# Patient Record
Sex: Female | Born: 1995
Health system: Southern US, Community
[De-identification: ages and names within clinical notes are randomized; demographics above are authoritative.]

## PROBLEM LIST (undated history)

## (undated) DIAGNOSIS — M549 Dorsalgia, unspecified: Secondary | ICD-10-CM

## (undated) DIAGNOSIS — R0602 Shortness of breath: Secondary | ICD-10-CM

## (undated) DIAGNOSIS — F32A Depression, unspecified: Secondary | ICD-10-CM

## (undated) DIAGNOSIS — F419 Anxiety disorder, unspecified: Secondary | ICD-10-CM

## (undated) DIAGNOSIS — F329 Major depressive disorder, single episode, unspecified: Secondary | ICD-10-CM

## (undated) DIAGNOSIS — A749 Chlamydial infection, unspecified: Secondary | ICD-10-CM

## (undated) DIAGNOSIS — M2550 Pain in unspecified joint: Secondary | ICD-10-CM

## (undated) DIAGNOSIS — F988 Other specified behavioral and emotional disorders with onset usually occurring in childhood and adolescence: Secondary | ICD-10-CM

## (undated) DIAGNOSIS — A599 Trichomoniasis, unspecified: Secondary | ICD-10-CM

## (undated) DIAGNOSIS — O139 Gestational [pregnancy-induced] hypertension without significant proteinuria, unspecified trimester: Secondary | ICD-10-CM

## (undated) DIAGNOSIS — K589 Irritable bowel syndrome without diarrhea: Secondary | ICD-10-CM

## (undated) DIAGNOSIS — G4730 Sleep apnea, unspecified: Secondary | ICD-10-CM

## (undated) DIAGNOSIS — Z8619 Personal history of other infectious and parasitic diseases: Secondary | ICD-10-CM

## (undated) HISTORY — DX: Chlamydial infection, unspecified: A74.9

## (undated) HISTORY — PX: NO PAST SURGERIES: SHX2092

## (undated) HISTORY — DX: Irritable bowel syndrome, unspecified: K58.9

## (undated) HISTORY — DX: Other specified behavioral and emotional disorders with onset usually occurring in childhood and adolescence: F98.8

## (undated) HISTORY — DX: Dorsalgia, unspecified: M54.9

## (undated) HISTORY — DX: Sleep apnea, unspecified: G47.30

## (undated) HISTORY — DX: Shortness of breath: R06.02

## (undated) HISTORY — DX: Personal history of other infectious and parasitic diseases: Z86.19

## (undated) HISTORY — DX: Pain in unspecified joint: M25.50

---
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
## 2014-07-24 ENCOUNTER — Encounter: Payer: Self-pay | Admitting: Advanced Practice Midwife

## 2015-04-18 LAB — OB RESULTS CONSOLE HGB/HCT, BLOOD
HEMATOCRIT: 37 %
Hemoglobin: 12.4 g/dL

## 2015-04-18 LAB — OB RESULTS CONSOLE PLATELET COUNT: Platelets: 192 10*3/uL

## 2015-04-18 LAB — OB RESULTS CONSOLE HEPATITIS B SURFACE ANTIGEN: HEP B S AG: NEGATIVE

## 2015-04-18 LAB — OB RESULTS CONSOLE HIV ANTIBODY (ROUTINE TESTING): HIV: NONREACTIVE

## 2015-04-18 LAB — OB RESULTS CONSOLE VARICELLA ZOSTER ANTIBODY, IGG: VARICELLA IGG: IMMUNE

## 2015-04-18 LAB — OB RESULTS CONSOLE RPR: RPR: NONREACTIVE

## 2015-07-24 LAB — OB RESULTS CONSOLE RPR: RPR: NONREACTIVE

## 2015-07-24 LAB — OB RESULTS CONSOLE HIV ANTIBODY (ROUTINE TESTING): HIV: NONREACTIVE

## 2015-09-19 LAB — OB RESULTS CONSOLE GC/CHLAMYDIA
Chlamydia: NEGATIVE
Gonorrhea: POSITIVE

## 2015-09-19 LAB — OB RESULTS CONSOLE GBS: GBS: POSITIVE

## 2015-09-23 NOTE — L&D Delivery Note (Cosign Needed)
Delivery Note Pt pushed well and at 3:57 AM a viable female was delivered via Vaginal, Spontaneous Delivery (Presentation: Left Occiput Anterior).  APGAR:8 ,9 ; weight 5 lb 11.9 oz (2605 g).  Cord clamped and cut by mother of pt. Placenta status: Intact, Spontaneous.  Cord: 3 vessels   Anesthesia: None  Episiotomy: None Lacerations: None Est. Blood Loss (mL): 372  Mom to postpartum.  Baby to Couplet care / Skin to Skin.  Cam HaiSHAW, Remer Couse CNM 09/29/2015, 4:16 AM

## 2015-09-28 ENCOUNTER — Inpatient Hospital Stay (HOSPITAL_COMMUNITY)
Admission: AD | Admit: 2015-09-28 | Discharge: 2015-10-01 | DRG: 775 | Disposition: A | Discharge status: Home / Self Care | Payer: Medicaid Other | Source: Ambulatory Visit | Attending: Obstetrics and Gynecology | Admitting: Obstetrics and Gynecology

## 2015-09-28 ENCOUNTER — Encounter (HOSPITAL_COMMUNITY): Payer: Self-pay

## 2015-09-28 DIAGNOSIS — Z818 Family history of other mental and behavioral disorders: Secondary | ICD-10-CM

## 2015-09-28 DIAGNOSIS — Z3A38 38 weeks gestation of pregnancy: Secondary | ICD-10-CM

## 2015-09-28 DIAGNOSIS — Z8249 Family history of ischemic heart disease and other diseases of the circulatory system: Secondary | ICD-10-CM

## 2015-09-28 DIAGNOSIS — Z823 Family history of stroke: Secondary | ICD-10-CM | POA: Diagnosis not present

## 2015-09-28 DIAGNOSIS — Z833 Family history of diabetes mellitus: Secondary | ICD-10-CM

## 2015-09-28 DIAGNOSIS — O98219 Gonorrhea complicating pregnancy, unspecified trimester: Principal | ICD-10-CM | POA: Diagnosis present

## 2015-09-28 DIAGNOSIS — IMO0001 Reserved for inherently not codable concepts without codable children: Secondary | ICD-10-CM

## 2015-09-28 LAB — URINALYSIS, ROUTINE W REFLEX MICROSCOPIC
GLUCOSE, UA: NEGATIVE mg/dL
KETONES UR: 40 mg/dL — AB
Nitrite: NEGATIVE
PROTEIN: 30 mg/dL — AB
Specific Gravity, Urine: >1.03 — ABNORMAL HIGH (ref 1.005–1.030)
pH: 6 (ref 5.0–8.0)

## 2015-09-28 LAB — URINE MICROSCOPIC-ADD ON: RBC / HPF: NONE SEEN RBC/hpf (ref 0–5)

## 2015-09-28 LAB — PROTEIN / CREATININE RATIO, URINE
Creatinine, Urine: 333 mg/dL
Protein Creatinine Ratio: 0.12 mg/mg{Cre} (ref 0.00–0.15)
Total Protein, Urine: 40 mg/dL

## 2015-09-28 LAB — TYPE AND SCREEN
ABO/RH(D): O POS
Antibody Screen: NEGATIVE

## 2015-09-28 LAB — COMPREHENSIVE METABOLIC PANEL
ALT: 22 U/L (ref 14–54)
AST: 30 U/L (ref 15–41)
Albumin: 3.5 g/dL (ref 3.5–5.0)
Alkaline Phosphatase: 163 U/L — ABNORMAL HIGH (ref 38–126)
Anion gap: 11 (ref 5–15)
BUN: 10 mg/dL (ref 6–20)
CO2: 22 mmol/L (ref 22–32)
Calcium: 8.6 mg/dL — ABNORMAL LOW (ref 8.9–10.3)
Chloride: 100 mmol/L — ABNORMAL LOW (ref 101–111)
Creatinine, Ser: 0.55 mg/dL (ref 0.44–1.00)
GFR calc Af Amer: >60 mL/min (ref >60)
GFR calc non Af Amer: >60 mL/min (ref >60)
Glucose, Bld: 84 mg/dL (ref 65–99)
Potassium: 2.7 mmol/L — CL (ref 3.5–5.1)
Sodium: 133 mmol/L — ABNORMAL LOW (ref 135–145)
Total Bilirubin: 0.9 mg/dL (ref 0.3–1.2)
Total Protein: 7.1 g/dL (ref 6.5–8.1)

## 2015-09-28 LAB — ABO/RH: ABO/RH(D): O POS

## 2015-09-28 LAB — RAPID URINE DRUG SCREEN, HOSP PERFORMED
Amphetamines: NOT DETECTED
Barbiturates: NOT DETECTED
Benzodiazepines: NOT DETECTED
Cocaine: NOT DETECTED
Opiates: NOT DETECTED
Tetrahydrocannabinol: NOT DETECTED

## 2015-09-28 LAB — CBC
HCT: 36.3 % (ref 36.0–46.0)
Hemoglobin: 11.8 g/dL — ABNORMAL LOW (ref 12.0–15.0)
MCH: 27.9 pg (ref 26.0–34.0)
MCHC: 32.5 g/dL (ref 30.0–36.0)
MCV: 85.8 fL (ref 78.0–100.0)
Platelets: 238 10*3/uL (ref 150–400)
RBC: 4.23 MIL/uL (ref 3.87–5.11)
RDW: 14 % (ref 11.5–15.5)
WBC: 10.8 10*3/uL — ABNORMAL HIGH (ref 4.0–10.5)

## 2015-09-28 LAB — GROUP B STREP BY PCR: Group B strep by PCR: NEGATIVE

## 2015-09-28 MED ORDER — OXYCODONE-ACETAMINOPHEN 5-325 MG PO TABS: 2.0000 | ORAL_TABLET | ORAL | Status: DC | PRN

## 2015-09-28 MED ORDER — LACTATED RINGERS IV SOLN: INTRAVENOUS | Status: DC

## 2015-09-28 MED ORDER — FENTANYL CITRATE (PF) 100 MCG/2ML IJ SOLN: 50.0000 ug | INTRAMUSCULAR | Status: DC | PRN

## 2015-09-28 MED ORDER — LACTATED RINGERS IV SOLN: 500.0000 mL | INTRAVENOUS | Status: DC | PRN

## 2015-09-28 MED ORDER — PENICILLIN G POTASSIUM 5000000 UNITS IJ SOLR
2.5000 10*6.[IU] | INTRAVENOUS | Status: DC
  Filled 2015-09-28 (×2): qty 2.5

## 2015-09-28 MED ORDER — MISOPROSTOL 50MCG HALF TABLET
50.0000 ug | ORAL_TABLET | ORAL | Status: DC
  Filled 2015-09-28: qty 0.5

## 2015-09-28 MED ORDER — CEFAZOLIN SODIUM 1 G IJ SOLR
2.0000 g | Freq: Once | INTRAMUSCULAR | Status: AC
  Administered 2015-09-28: 2 g via INTRAMUSCULAR
  Filled 2015-09-28: qty 20

## 2015-09-28 MED ORDER — OXYTOCIN 10 UNIT/ML IJ SOLN: 2.5000 [IU]/h | INTRAVENOUS | Status: DC

## 2015-09-28 MED ORDER — ACETAMINOPHEN 325 MG PO TABS: 650.0000 mg | ORAL_TABLET | ORAL | Status: DC | PRN

## 2015-09-28 MED ORDER — OXYCODONE-ACETAMINOPHEN 5-325 MG PO TABS: 1.0000 | ORAL_TABLET | ORAL | Status: DC | PRN

## 2015-09-28 MED ORDER — ONDANSETRON HCL 4 MG/2ML IJ SOLN: 4.0000 mg | Freq: Four times a day (QID) | INTRAMUSCULAR | Status: DC | PRN

## 2015-09-28 MED ORDER — OXYTOCIN BOLUS FROM INFUSION: 500.0000 mL | INTRAVENOUS | Status: DC

## 2015-09-28 MED ORDER — TERBUTALINE SULFATE 1 MG/ML IJ SOLN
0.2500 mg | Freq: Once | INTRAMUSCULAR | Status: DC | PRN
  Filled 2015-09-28: qty 1

## 2015-09-28 MED ORDER — PENICILLIN G POTASSIUM 5000000 UNITS IJ SOLR
5.0000 10*6.[IU] | Freq: Once | INTRAVENOUS | Status: DC
  Filled 2015-09-28: qty 5

## 2015-09-28 MED ORDER — MISOPROSTOL 25 MCG QUARTER TABLET
25.0000 ug | ORAL_TABLET | ORAL | Status: DC
  Administered 2015-09-28: 25 ug via VAGINAL
  Filled 2015-09-28 (×2): qty 1

## 2015-09-28 MED ORDER — CITRIC ACID-SODIUM CITRATE 334-500 MG/5ML PO SOLN: 30.0000 mL | ORAL | Status: DC | PRN

## 2015-09-28 MED ORDER — AMPICILLIN 500 MG PO CAPS: 500.0000 mg | ORAL_CAPSULE | Freq: Four times a day (QID) | ORAL | Status: DC

## 2015-09-28 MED ORDER — LIDOCAINE HCL (PF) 1 % IJ SOLN
30.0000 mL | INTRAMUSCULAR | Status: DC | PRN
  Filled 2015-09-28: qty 30

## 2015-09-28 MED ORDER — FLEET ENEMA 7-19 GM/118ML RE ENEM: 1.0000 | ENEMA | Freq: Every day | RECTAL | Status: DC | PRN

## 2015-09-28 NOTE — Progress Notes (Signed)
Patient inquiring about whether the baby will go to the nursery. Patient informed of model of care in which the baby stays in mothers room for bonding and care. Patient states she will "have to call someone to come to hospital and take care of baby". Nurse asked patient if she was going to care for baby in hospital. Patient states "I am tired and I will need to sleep".

## 2015-09-28 NOTE — Progress Notes (Signed)
IV attempt by Mary [name] RNC. Attempt unsuccessful. During attempt pt agitated, crying. Patient told mother and nursing staff to "get away from her". Pt refusing additional IV attempts.

## 2015-09-28 NOTE — Progress Notes (Signed)
Dr. Emelda FearFerguson notified patient refusing IV initiation/antibiotics, monitoring, vaginal exams.

## 2015-09-28 NOTE — Progress Notes (Signed)
Patient ID: Mary [name], female   DOB: [birth year], 20 y.o.   MRN: [id number] Filed Vitals:   09/28/15 1636 09/28/15 1741 09/28/15 1841 09/28/15 2058  BP: 137/83 144/93 154/80   Pulse: 81 93 78 65  Temp:  98.2 F (36.8 C)  97.4 F (36.3 C)  TempSrc:    Oral  Resp: 18 18  20   Height:      Weight:       Patient curled up in fetal position in bathtub with pillow (no water) States is comfortable there and wants to sleepl  FHR has been reactive on cordless monitor UCs irregular  Patient would only let nurse insert Cytotec.  Continues to refuse IV for Pitocin or antibiotics I told her it is possible that the baby might get sick from GBS and have to stay in nursery Pt states "as long as I can go home, I don't care"  Dilation: 5 Effacement (%): 80 Station: -1 Presentation: Vertex Exam by:: e. poore, rn  Will continue to observe

## 2015-09-28 NOTE — MAU Note (Signed)
Patient presents with prom around 1100. Patient denies any contractions. Fetus active.

## 2015-09-28 NOTE — H&P (Signed)
Mary [name] G2P1001 @ [redacted]w[redacted]d  20 y.o. female presenting for SROM @ 1100 am. Irregular contractions. No bleeding. Receives care in [place]. No records with her. Reports she was treated for Gonorrhea and CX 2 days ago.She has had 1 vaginal delivery 4 years ago and reports no problems in this pregnancy. GBS status unknown.  Maternal Medical History:  Reason for admission: Rupture of membranes.   Contractions: Onset was 3-5 hours ago.   Frequency: irregular.   Perceived severity is mild.    Fetal activity: Perceived fetal activity is normal.   Last perceived fetal movement was within the past hour.    Prenatal complications: Infection.   Prenatal Complications - Diabetes: none.    OB History    Gravida Para Term Preterm AB TAB SAB Ectopic Multiple Living   2 1 1  0 0 0 0 0 0 1     Past Medical History  Diagnosis Date  . Asthma     as a child  . Depression   . Anxiety   . Trichomonas    Past Surgical History  Procedure Laterality Date  . No past surgeries     Family History: family history includes Bipolar disorder in her mother; Cancer in her maternal grandmother and maternal uncle; Depression in her mother; Diabetes in her mother; Diabetes type II in her mother; Hypertension in her mother; Mental illness in her mother; Obesity in her mother; Seizures in her mother; Stroke in her maternal grandmother. Social History:  reports that she has never smoked. She has never used smokeless tobacco. She reports that she does not drink alcohol or use illicit drugs.   Prenatal Transfer Tool  Maternal Diabetes: No Genetic Screening:  Maternal Ultrasounds/Referrals:  Fetal Ultrasounds or other Referrals:   Maternal Substance Abuse:  No Significant Maternal Medications:  None Significant Maternal Lab Results:  Lab values include: Other:  Other Comments:  Gonorrhea positive; Treated for Ch/Gon  Review of Systems  Gastrointestinal: Positive for abdominal pain.  Genitourinary:     Water broke    Dilation: 2 Effacement (%): 80 Station: -2 Exam by:: Lori Clemmons CNM  Blood pressure 134/81, pulse 82, temperature 98 F (36.7 C), temperature source Oral, resp. rate 16, height 5\' 5"  (1.651 m), weight 83.915 kg (185 lb), last menstrual period 01/05/2015. Maternal Exam:  Uterine Assessment: Contraction strength is mild.  Contraction frequency is irregular.   Abdomen: Patient reports no abdominal tenderness. Fetal presentation: vertex  Introitus: Normal vulva. Vulva is negative for lesion.  Normal vagina.  Amniotic fluid character: clear.  Pelvis: adequate for delivery.   Cervix: Cervix evaluated by sterile speculum exam.     Fetal Exam Fetal Monitor Review: Mode: ultrasound.   Variability: moderate (6-25 bpm).   Pattern: accelerations present and no decelerations.    Fetal State Assessment: Category I - tracings are normal.     Physical Exam  Nursing note and vitals reviewed. Constitutional: She is oriented to person, place, and time. She appears well-developed and well-nourished. No distress.  HENT:  Head: Normocephalic and atraumatic.  Neck: Normal range of motion. Neck supple.  Cardiovascular: Normal rate and regular rhythm.   Respiratory: Effort normal and breath sounds normal. No respiratory distress.  Genitourinary: Vagina normal. Vulva exhibits no lesion.  Musculoskeletal: Normal range of motion.  Neurological: She is alert and oriented to person, place, and time.  Skin: Skin is warm and dry.  Psychiatric: She has a normal mood and affect. Her behavior is normal. Judgment and thought content  normal.    Prenatal labs: ABO, Rh:   Antibody:   Rubella:   RPR:    HBsAg:    HIV:    GBS:     Assessment/Plan: IUP @ 38+0 Admit for term labor Request Records- pending Gc/CH cx's GBS Epidural Expectant management    Clemmons,Lori Grissett 09/28/2015, 3:07 PM

## 2015-09-28 NOTE — Progress Notes (Signed)
RN attempted to get patient back on FHM, patient has refused for the past 15 minutes. Continuing to shower.

## 2015-09-28 NOTE — Progress Notes (Signed)
Pt asked if nursing staff could start IV at this time. The patient responded with "NO, not now". Patient informed antibiotics are needed to treat GBS positive status prior to delivery. Informed patient the lack of antibiotics puts baby at greater risk of transmission of GBS. Pt continues to refuse IV.

## 2015-09-28 NOTE — Progress Notes (Signed)
Mary [name] is a 20 y.o. G2P1001 at [redacted]w[redacted]d by  admitted for rupture of membranes  Subjective: Pt is very noncompliant. She does not want an IV. It was explained to her in detail about receiving IV antibiotics for positive GBS. She refused. She was advised she would be going against medical advice. Consulted with Dr. Emelda Fear . Will use PO Ampicillin  Objective: BP 154/80 mmHg  Pulse 78  Temp(Src) 98.2 F (36.8 C) (Oral)  Resp 18  Ht 5\' 5"  (1.651 m)  Wt 83.915 kg (185 lb)  BMI 30.79 kg/m2  LMP 01/05/2015      FHT:  Cat 1 UC:   irregular SVE:   Dilation: 2 Effacement (%): 80 Station: -2 Exam by:: [employer] CNM   Labs: Lab Results  Component Value Date   WBC 10.8* 09/28/2015   HGB 11.8* 09/28/2015   HCT 36.3 09/28/2015   MCV 85.8 09/28/2015   PLT 238 09/28/2015   Results for orders placed or performed during the hospital encounter of 09/28/15 (from the past 24 hour(s))  CBC     Status: Abnormal   Collection Time: 09/28/15  4:12 PM  Result Value Ref Range   WBC 10.8 (H) 4.0 - 10.5 K/uL   RBC 4.23 3.87 - 5.11 MIL/uL   Hemoglobin 11.8 (L) 12.0 - 15.0 g/dL   HCT 82.9 56.2 - 13.0 %   MCV 85.8 78.0 - 100.0 fL   MCH 27.9 26.0 - 34.0 pg   MCHC 32.5 30.0 - 36.0 g/dL   RDW 86.5 78.4 - 69.6 %   Platelets 238 150 - 400 K/uL  Type and screen If Patient has: 1) Previous uterine surgery in active labor. 2) History of 4 or more term/preterm deliveries.  3) Abnormal bleeding. 4) Multiple gestation. 5) Polyhydramnios,  6) History postpartum hemorrhage. 7) Suspicion of macrosom     Status: None   Collection Time: 09/28/15  4:12 PM  Result Value Ref Range   ABO/RH(D) O POS    Antibody Screen NEG    Sample Expiration 10/01/2015   Comprehensive metabolic panel     Status: Abnormal   Collection Time: 09/28/15  4:12 PM  Result Value Ref Range   Sodium 133 (L) 135 - 145 mmol/L   Potassium 2.7 (LL) 3.5 - 5.1 mmol/L   Chloride 100 (L) 101 - 111 mmol/L   CO2 22 22 - 32 mmol/L   Glucose, Bld 84 65 - 99 mg/dL   BUN 10 6 - 20 mg/dL   Creatinine, Ser 2.95 0.44 - 1.00 mg/dL   Calcium 8.6 (L) 8.9 - 10.3 mg/dL   Total Protein 7.1 6.5 - 8.1 g/dL   Albumin 3.5 3.5 - 5.0 g/dL   AST 30 15 - 41 U/L   ALT 22 14 - 54 U/L   Alkaline Phosphatase 163 (H) 38 - 126 U/L   Total Bilirubin 0.9 0.3 - 1.2 mg/dL   GFR calc non Af Amer >60 >60 mL/min   GFR calc Af Amer >60 >60 mL/min   Anion gap 11 5 - 15  Urinalysis, Routine w reflex microscopic (not at [facility])     Status: Abnormal   Collection Time: 09/28/15  4:35 PM  Result Value Ref Range   Color, Urine AMBER (A) YELLOW   APPearance CLEAR CLEAR   Specific Gravity, Urine >1.030 (H) 1.005 - 1.030   pH 6.0 5.0 - 8.0   Glucose, UA NEGATIVE NEGATIVE mg/dL   Hgb urine dipstick TRACE (A) NEGATIVE   Bilirubin  Urine MODERATE (A) NEGATIVE   Ketones, ur 40 (A) NEGATIVE mg/dL   Protein, ur 30 (A) NEGATIVE mg/dL   Nitrite NEGATIVE NEGATIVE   Leukocytes, UA SMALL (A) NEGATIVE  Protein / creatinine ratio, urine     Status: None   Collection Time: 09/28/15  4:35 PM  Result Value Ref Range   Creatinine, Urine 333.00 mg/dL   Total Protein, Urine 40 mg/dL   Protein Creatinine Ratio 0.12 0.00 - 0.15 mg/mg[Cre]  Urine rapid drug screen (hosp performed)     Status: None   Collection Time: 09/28/15  4:35 PM  Result Value Ref Range   Opiates NONE DETECTED NONE DETECTED   Cocaine NONE DETECTED NONE DETECTED   Benzodiazepines NONE DETECTED NONE DETECTED   Amphetamines NONE DETECTED NONE DETECTED   Tetrahydrocannabinol NONE DETECTED NONE DETECTED   Barbiturates NONE DETECTED NONE DETECTED  Urine microscopic-add on     Status: Abnormal   Collection Time: 09/28/15  4:35 PM  Result Value Ref Range   Squamous Epithelial / LPF 0-5 (A) NONE SEEN   WBC, UA 0-5 0 - 5 WBC/hpf   RBC / HPF NONE SEEN 0 - 5 RBC/hpf   Bacteria, UA RARE (A) NONE SEEN   Crystals CA OXALATE CRYSTALS (A) NEGATIVE   Urine-Other TRICHOMONAS PRESENT   Group B strep by PCR      Status: None   Collection Time: 09/28/15  5:00 PM  Result Value Ref Range   Group B strep by PCR NEGATIVE NEGATIVE    Assessment / Plan: Augment with Cyttoec  Labor:  Preeclampsia:   Fetal Wellbeing:  Category I Pain Control:  Labor support without medications I/D:  gbs negative result by PCR today Anticipated MOD:  NSVD  Mary [name] 09/28/2015, 7:10 PM

## 2015-09-29 ENCOUNTER — Encounter (HOSPITAL_COMMUNITY): Payer: Self-pay | Admitting: *Deleted

## 2015-09-29 DIAGNOSIS — Z3A38 38 weeks gestation of pregnancy: Secondary | ICD-10-CM

## 2015-09-29 DIAGNOSIS — O98219 Gonorrhea complicating pregnancy, unspecified trimester: Secondary | ICD-10-CM

## 2015-09-29 LAB — RPR: RPR Ser Ql: NONREACTIVE

## 2015-09-29 MED ORDER — SENNOSIDES-DOCUSATE SODIUM 8.6-50 MG PO TABS
2.0000 | ORAL_TABLET | ORAL | Status: DC
  Administered 2015-09-29 – 2015-10-01 (×2): 2 via ORAL
  Filled 2015-09-29 (×2): qty 2

## 2015-09-29 MED ORDER — WITCH HAZEL-GLYCERIN EX PADS: 1.0000 "application " | MEDICATED_PAD | CUTANEOUS | Status: DC | PRN

## 2015-09-29 MED ORDER — BENZOCAINE-MENTHOL 20-0.5 % EX AERO: 1.0000 "application " | INHALATION_SPRAY | CUTANEOUS | Status: DC | PRN

## 2015-09-29 MED ORDER — ONDANSETRON HCL 4 MG PO TABS: 4.0000 mg | ORAL_TABLET | ORAL | Status: DC | PRN

## 2015-09-29 MED ORDER — TETANUS-DIPHTH-ACELL PERTUSSIS 5-2.5-18.5 LF-MCG/0.5 IM SUSP: 0.5000 mL | Freq: Once | INTRAMUSCULAR | Status: DC

## 2015-09-29 MED ORDER — OXYTOCIN 10 UNIT/ML IJ SOLN
INTRAMUSCULAR | Status: AC
  Administered 2015-09-29: 10 [IU]
  Filled 2015-09-29: qty 1

## 2015-09-29 MED ORDER — DIBUCAINE 1 % RE OINT: 1.0000 "application " | TOPICAL_OINTMENT | RECTAL | Status: DC | PRN

## 2015-09-29 MED ORDER — SIMETHICONE 80 MG PO CHEW: 80.0000 mg | CHEWABLE_TABLET | ORAL | Status: DC | PRN

## 2015-09-29 MED ORDER — PROMETHAZINE HCL 25 MG/ML IJ SOLN
12.5000 mg | INTRAMUSCULAR | Status: DC | PRN
  Administered 2015-09-29: 12.5 mg via INTRAMUSCULAR
  Filled 2015-09-29: qty 1

## 2015-09-29 MED ORDER — LANOLIN HYDROUS EX OINT: TOPICAL_OINTMENT | CUTANEOUS | Status: DC | PRN

## 2015-09-29 MED ORDER — DIPHENHYDRAMINE HCL 25 MG PO CAPS: 25.0000 mg | ORAL_CAPSULE | Freq: Four times a day (QID) | ORAL | Status: DC | PRN

## 2015-09-29 MED ORDER — BUTORPHANOL TARTRATE 2 MG/ML IJ SOLN
2.0000 mg | INTRAMUSCULAR | Status: DC | PRN
  Administered 2015-09-29: 2 mg via INTRAMUSCULAR
  Filled 2015-09-29: qty 2

## 2015-09-29 MED ORDER — ZOLPIDEM TARTRATE 5 MG PO TABS: 5.0000 mg | ORAL_TABLET | Freq: Every evening | ORAL | Status: DC | PRN

## 2015-09-29 MED ORDER — PRENATAL MULTIVITAMIN CH
1.0000 | ORAL_TABLET | Freq: Every day | ORAL | Status: DC
  Administered 2015-09-29 – 2015-10-01 (×2): 1 via ORAL
  Filled 2015-09-29 (×3): qty 1

## 2015-09-29 MED ORDER — IBUPROFEN 600 MG PO TABS
600.0000 mg | ORAL_TABLET | Freq: Four times a day (QID) | ORAL | Status: DC
  Administered 2015-09-29 – 2015-10-01 (×10): 600 mg via ORAL
  Filled 2015-09-29 (×10): qty 1

## 2015-09-29 MED ORDER — ACETAMINOPHEN 325 MG PO TABS
650.0000 mg | ORAL_TABLET | ORAL | Status: DC | PRN
  Administered 2015-09-29 (×2): 650 mg via ORAL
  Filled 2015-09-29 (×2): qty 2

## 2015-09-29 MED ORDER — ONDANSETRON HCL 4 MG/2ML IJ SOLN: 4.0000 mg | INTRAMUSCULAR | Status: DC | PRN

## 2015-09-29 NOTE — Progress Notes (Signed)
Dr Emelda FearFerguson & L. Clemmons, CNM at bedside discussing with patient plan of care, and what patient is agreeable too. After thoroughly discussing risks of patient not receiving IV antibiotics & IV fluid, patient is continuing to refuse IV. Will continue to monitor & assess patient.

## 2015-09-29 NOTE — Progress Notes (Signed)
Mary LarocheM. [name], CNM at bedside. Discussing with patient plan of care. Patient refused to let CNM check cervix and place cytotec, but let RN do so. Patient is continuing to refuse IV, after discussing risks to baby if GBS not appropriately treated. Patient stated she doesn't care, as long as she can go home.
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
## 2015-09-29 NOTE — Lactation Note (Signed)
This note was [facility] Lactation Consultation Note; Initial visit with mom. Baby <6 lb Mom reports that baby has been latching well but she has been sleepy since bath. Baby skin to skin with mom. She is licking and nuzzling some at the breast then goes off to sleep. Reviewed basic teaching with mom but she does not appear interested in assist. Reports baby last fed before bath.. Reviewed feeding cues and encouraged to feed whenever she sees them Encouraged to call for assist prn BF brochure given with resources for support after DC. No questions at present.  Patient Name: Mary [name] Reason for consult: Initial assessment   Maternal Data Formula Feeding for Exclusion: Yes Reason for exclusion: Mother's choice to formula and breast feed on admission Does the patient have breastfeeding experience prior to this delivery?: No  Feeding Feeding Type: Breast Fed Length of feed: 7 min  LATCH Score/Interventions Latch: Too sleepy or reluctant, no latch achieved, no sucking elicited. Intervention(s): Adjust position;Assist with latch;Breast compression  Audible Swallowing: None Intervention(s): Skin to skin;Hand expression Intervention(s): Skin to skin;Hand expression  Type of Nipple: Everted at rest and after stimulation  Comfort (Breast/Nipple): Soft / non-tender     Hold (Positioning): Assistance needed to correctly position infant at breast and maintain latch. Intervention(s): Breastfeeding basics reviewed  LATCH Score: 5  Lactation Tools Discussed/Used     Consult Status Consult Status: Follow-up Date: 09/30/15 Follow-up type: In-patient    Pamelia HoitWeeks, [name], 2:03 PM

## 2015-09-30 NOTE — Clinical Social Work Maternal (Signed)
CLINICAL SOCIAL WORK MATERNAL/CHILD NOTE  Patient Details  Name: Mary [name] MRN: [id number] Date of Birth: 1996-06-05  Date:  09/30/2015  Clinical Social Worker Initiating Note:  Mary [name] Date/ Time Initiated:  09/30/15/1030     Child's Name:  Mary [name]   Legal Guardian:  Mother   Need for Interpreter:  None   Date of Referral:  09/29/15     Reason for Referral:  Other (Comment)   Referral Source:  [facility]   Address:  [address]  [place], [place] [address]  Phone number:   [phone])   Household Members:  Self, Minor Children, Relatives   Natural Supports (not living in the home):  Extended Family, Immediate Family   Professional Supports: Case Metallurgist ([place] and Therapist Mary [name])   Employment: Unemployed   Type of Work:     Education:      Pensions consultant:  [employer]   Other Resources:      Cultural/Religious Considerations Which May Impact Care: none noted  Strengths:  Ability to meet basic needs , Home prepared for child    Risk Factors/Current Problems:   (Hx of mental illness)   Cognitive State:  Alert , Able to Concentrate    Mood/Affect:   (guarded at times)   CSW Assessment:  Acknowledged order for social work consult regarding inappropriate comments made by mother.  Mother reportedly stated that the baby inside her was Mary [name] and that she should have aborted the child.   Met with mother and maternal grandmother.  Grandmother states that [name] and her 20 year old son reside with her.  When [name] was asked if she had hx of bipolar disorder, grandmother responded that "I was diagnosed with bipolar disorder".  Mother admits to hx of depression and anxiety.  She stated "maybe I should be tested for bipolar".  Informed that she receives mental health services from [facility] and is prescribed abilify.  She notes that she stopped the medication because of the pregnancy, but  plans to continue with medication.  She also reports hx of therapy and states that her therapist is Mary Thorton.  She also reports hx of psychiatric hospitalization at [facility] when she was age 20.    [name] is a single parent.  Informed that FOB is uninvolved and has not visited with her and newborn at the hospital.  [name] denies any hx of substance abuse.  Spoke with mother regarding her mental health history.  [name] acknowledged that her thoughts and comments during pregnancy and since arriving at the hospital have been irrational.  "I didn't feel normal during both pregnancies".   She described having irrational fears and al inappropriate and unusual thoughts and feelings.  She denies any intentional harm to self or other people during these times.  "This pregnancy was much better that the previous one".    Discussed some of the statements made since she was hospitalized.  She didn't want to talk about it and seemed embarrassed by some of the statements she have made.   Encouraged her to talk about the mood swings and inappropriate behaviors.  She did and commented that her statements were not rational.  She also acknowledge having mood swings.  She denies any SI or HI.  She denies having any negative feelings towards newborn.  Further noting that she was happy about having the baby.  She admits to being "lazy", and notes that she would never be alone with the baby.  Informed that her uncle is very supportive and visits often. She also notes that her mother is usually at home and she often have visits from other family members.  [name] states that she maintains custody of her 21 year old Mary [name] and has no hx of DSS involvement.   Mother was guarded and suspicious of CSW. Appropriate interaction noted with newborn during [place] visit, but she was short with her mother.  Provided supportive feedback.    Mother informed of social work Fish farm manager.  CSW Plan/Description:     Discussed signs/symptoms of PP Depression  and available resources Discussed hospital's drug screen policy.  Screening using umbilical cord is pending Spoke with her regarding referral to [facility] and she was in agreement.   Mother agreed to contact [place] to schedule apt. with her psychiatrist to resume medication and schedule appointment with Therapist ASAP.  Will continue to monitor drug screen.   No barriers to discharge   Mary [name] J, LCSW 09/30/2015, 1:49 PM

## 2015-09-30 NOTE — Lactation Note (Signed)
This note [name] Lactation Consultation [facility]  Patient Name: Mary Philis PiqueJasmine Dula [name] Date: 09/30/2015 Reason for consult: Follow-up assessment   Follow up with mom of 35 hour old infant. Infant with 4 BF for 10-25 minutes, 5 BF attempts, formula x 4 10-17 cc, 3 voids and 2 stools in last 24 hours. LATCH scores of 5-8 by bedside RN. Infant 5 lb 10.2 oz with a 2 % weight loss since birth. Mom reports that her breasts are more tender today. Advised her to begin pumping when infant wont latch to initiate milk supply. Mom at first declined and then agreed. Pump set up, pumping and cleaning reviewed. Infant cueing to feed, mom declined assistance with latch and gave the baby a bottle. Enc mom to call with questions/concerns.   Maternal Data Formula Feeding for Exclusion: No  Feeding Feeding Type: Breast Fed Nipple Type: Regular Length of feed: 10 min  LATCH Score/Interventions                      Lactation Tools Discussed/Used Pump Review: Setup, frequency, and cleaning;Milk Storage Initiated by:: Mary StainSharon Mercede Rollo, RN, IBCLC Date initiated:: 09/30/15   Consult Status Consult Status: Follow-up Date: 10/01/15 Follow-up type: In-patient    Mary [name] 09/30/2015, 3:21 PM

## 2015-09-30 NOTE — Progress Notes (Signed)
Post Partum Day 1 Subjective: no complaints, up ad lib, voiding and tolerating PO  Objective: Blood pressure 109/64, pulse 79, temperature 97.8 F (36.6 C), temperature source Oral, resp. rate 18, height 5\' 5"  (1.651 m), weight 185 lb (83.915 kg), last menstrual period 01/05/2015, SpO2 100 %, unknown if currently breastfeeding.  Physical Exam:  General: alert, cooperative, appears stated age and no distress Lochia: appropriate Uterine Fundus: firm Incision: n/a DVT Evaluation: No evidence of DVT seen on physical exam. Negative Homan's sign. No cords or calf tenderness.   Recent Labs  09/28/15 1612  HGB 11.8*  HCT 36.3    Assessment/Plan: Plan for discharge tomorrow   LOS: 2 days   LAWSON, MARIE DARLENE 09/30/2015, 9:04 AM

## 2015-10-01 MED ORDER — IBUPROFEN 600 MG PO TABS: 600.0000 mg | ORAL_TABLET | Freq: Four times a day (QID) | ORAL | Status: DC | PRN

## 2015-10-01 MED ORDER — ACETAMINOPHEN 325 MG PO TABS: 650.0000 mg | ORAL_TABLET | ORAL | Status: DC | PRN

## 2015-10-01 MED ORDER — SENNOSIDES-DOCUSATE SODIUM 8.6-50 MG PO TABS: 1.0000 | ORAL_TABLET | Freq: Every day | ORAL | Status: DC

## 2015-10-01 NOTE — Discharge Summary (Signed)
OB Discharge Summary     Patient Name: Mary [name] DOB: [birth year] MRN: [id number]  Date of admission: 09/28/2015 Delivering MD: Cam Hai D   Date of discharge: 10/01/2015  Admitting diagnosis: 37WKS WATER BROKE Intrauterine pregnancy: [redacted]w[redacted]d     Secondary diagnosis:  Active Problems:   Active labor at term  Additional problems:  Gonorrhea during pregnancy (treated 2 days prior to presentation)     Discharge diagnosis: Term Pregnancy Delivered                                                                                                Post partum procedures:none  Augmentation: none  Complications: None  Hospital course:  Onset of Labor With Vaginal Delivery     20 y.o. yo [id number] at [redacted]w[redacted]d was admitted in Latent Labor on 09/28/2015. Patient had an uncomplicated labor course as follows:  Membrane Rupture Time/Date: 11:00 AM ,09/28/2015   Intrapartum Procedures: Episiotomy: None [1]                                         Lacerations:  None [1]  Patient had a delivery of a Viable infant. 09/29/2015  Information for the patient's newborn:  Mikenna, Bunkley [[id number]]  Delivery Method: Vaginal, Spontaneous Delivery (Filed from Delivery Summary)     Pateint had an uncomplicated postpartum course.  She is ambulating, tolerating a regular diet, passing flatus, and urinating well. Patient is discharged home in stable condition on 10/01/2015.    Physical exam  Filed Vitals:   09/29/15 1731 09/30/15 0612 09/30/15 1728 10/01/15 0507  BP: 112/55 109/64 119/62 111/54  Pulse: 74 79 88 84  Temp: 97.6 F (36.4 C) 97.8 F (36.6 C) 97.9 F (36.6 C) 97.8 F (36.6 C)  TempSrc: Oral Oral Oral Oral  Resp: 20 18 20 16   Height:      Weight:      SpO2:  100%  100%   General: alert, cooperative and no distress Uterine Fundus: firm Incision: N/A DVT Evaluation: No evidence of DVT seen on physical exam. Negative Homan's sign. No cords or calf tenderness. No significant  calf/ankle edema. Labs: Lab Results  Component Value Date   WBC 10.8* 09/28/2015   HGB 11.8* 09/28/2015   HCT 36.3 09/28/2015   MCV 85.8 09/28/2015   PLT 238 09/28/2015   CMP Latest Ref Rng 09/28/2015  Glucose 65 - 99 mg/dL 84  BUN 6 - 20 mg/dL 10  Creatinine 1.30 - 8.65 mg/dL 7.84  Sodium 696 - 295 mmol/L 133(L)  Potassium 3.5 - 5.1 mmol/L 2.7(LL)  Chloride 101 - 111 mmol/L 100(L)  CO2 22 - 32 mmol/L 22  Calcium 8.9 - 10.3 mg/dL 2.8(U)  Total Protein 6.5 - 8.1 g/dL 7.1  Total Bilirubin 0.3 - 1.2 mg/dL 0.9  Alkaline Phos 38 - 126 U/L 163(H)  AST 15 - 41 U/L 30  ALT 14 - 54 U/L 22    Discharge instruction: per After Visit Summary and "Baby and Me Booklet".  After visit meds:    Medication List    TAKE these medications        acetaminophen 325 MG tablet  Commonly known as:  TYLENOL  Take 2 tablets (650 mg total) by mouth every 4 (four) hours as needed (for pain scale < 4).     ibuprofen 600 MG tablet  Commonly known as:  ADVIL,MOTRIN  Take 1 tablet (600 mg total) by mouth every 6 (six) hours as needed for mild pain or moderate pain.     prenatal multivitamin Tabs tablet  Take 1 tablet by mouth daily.     senna-docusate 8.6-50 MG tablet  Commonly known as:  Senokot-S  Take 1 tablet by mouth at bedtime.        Diet: routine diet  Activity: Advance as tolerated. Pelvic rest for 6 weeks.   Outpatient follow up:6 weeks Follow up Appt:No future appointments. Follow up Visit:No Follow-up on file.  Postpartum contraception: IUD Paragard  Newborn Data: Live born female  Birth Weight: 5 lb 11.9 oz (2605 g) APGAR: 8, 9  Baby Feeding: Breast Disposition:home with mother   10/01/2015 Palma HolterKanishka G Gunadasa, MD

## 2015-10-01 NOTE — Lactation Note (Signed)
This note was copied [facility] the [facility] Lactation Consultation Note; Baby just had formula and is asleep in bassinet. Mom reports breasts are getting fuller and has lump under right armpit. Reviewed engorgement prevention and treatment. Reports she has pump at home. Mom reports baby is latching well. States she will breastfeed after she has her breakfast. Mom has someone on phone and continued talking to them while I was in room. No questions at present.   Patient Name: Mary [name] [id number]'WToday's Date: 10/01/2015 Reason for consult: Follow-up assessment   Maternal Data Formula Feeding for Exclusion: No Does the patient have breastfeeding experience prior to this delivery?: No  Feeding Feeding Type: Formula  LATCH Score/Interventions                      Lactation Tools Discussed/Used     Consult Status Consult Status: Complete    Pamelia HoitWeeks, Pammie Chirino D 10/01/2015, 9:33 AM

## 2015-10-01 NOTE — Discharge Instructions (Signed)
Please contact [facility] to schedule apt. with your psychiatrist to resume medication and schedule appointment with Therapist as soon as possible  Vaginal Delivery, Care After Refer to this sheet in the next few weeks. These discharge instructions provide you with information on caring for yourself after delivery. Your health care provider may also give you specific instructions. Your treatment has been planned according to the most current medical practices available, but problems sometimes occur. Call your health care provider if you have any problems or questions after you go home. HOME CARE INSTRUCTIONS  Take over-the-counter or prescription medicines only as directed by your health care provider or pharmacist.  Do not drink alcohol, especially if you are breastfeeding or taking medicine to relieve pain.  Do not chew or smoke tobacco.  Do not use illegal drugs.  Continue to use good perineal care. Good perineal care includes:  Wiping your perineum from front to back.  Keeping your perineum clean.  Do not use tampons or douche until your health care provider says it is okay.  Shower, wash your hair, and take tub baths as directed by your health care provider.  Wear a well-fitting bra that provides breast support.  Eat healthy foods.  Drink enough fluids to keep your urine clear or pale yellow.  Eat high-fiber foods such as whole grain cereals and breads, brown rice, beans, and fresh fruits and vegetables every day. These foods may help prevent or relieve constipation.  Follow your health care provider's recommendations regarding resumption of activities such as climbing stairs, driving, lifting, exercising, or traveling.  Talk to your health care provider about resuming sexual activities. Resumption of sexual activities is dependent upon your risk of infection, your rate of healing, and your comfort and desire to resume sexual activity.  Try to have someone help you  with your household activities and your newborn for at least a few days after you leave the hospital.  Rest as much as possible. Try to rest or take a nap when your newborn is sleeping.  Increase your activities gradually.  Keep all of your scheduled postpartum appointments. It is very important to keep your scheduled follow-up appointments. At these appointments, your health care provider will be checking to make sure that you are healing physically and emotionally. SEEK MEDICAL CARE IF:   You are passing large clots from your vagina. Save any clots to show your health care provider.  You have a foul smelling discharge from your vagina.  You have trouble urinating.  You are urinating frequently.  You have pain when you urinate.  You have a change in your bowel movements.  You have increasing redness, pain, or swelling near your vaginal incision (episiotomy) or vaginal tear.  You have pus draining from your episiotomy or vaginal tear.  Your episiotomy or vaginal tear is separating.  You have painful, hard, or reddened breasts.  You have a severe headache.  You have blurred vision or see spots.  You feel sad or depressed.  You have thoughts of hurting yourself or your newborn.  You have questions about your care, the care of your newborn, or medicines.  You are dizzy or light-headed.  You have a rash.  You have nausea or vomiting.  You were breastfeeding and have not had a menstrual period within 12 weeks after you stopped breastfeeding.  You are not breastfeeding and have not had a menstrual period by the 12th week after delivery.  You have a fever. SEEK IMMEDIATE MEDICAL  CARE IF:   You have persistent pain.  You have chest pain.  You have shortness of breath.  You faint.  You have leg pain.  You have stomach pain.  Your vaginal bleeding saturates two or more sanitary pads in 1 hour.   This information is not intended to replace advice given to you by  your health care provider. Make sure you discuss any questions you have with your health care provider.   Document Released: 09/05/2000 Document Revised: 05/30/2015 Document Reviewed: 05/05/2012 Elsevier Interactive Patient Education [employer]

## 2015-10-07 NOTE — Discharge Summary (Signed)
  OB Discharge Summary     Patient Name: Mary [name] DOB: 05/30/1996 MRN: [id number]  Date of admission: 09/28/2015 Delivering MD: SHAW, KIMBERLY D   Date of discharge: 10/01/2015  Admitting diagnosis: 37WKS WATER BROKE Intrauterine pregnancy: [redacted]w[redacted]d     Secondary diagnosis:  Active Problems:   Active labor at term  Additional problems:  Gonorrhea during pregnancy (treated 2 days prior to presentation)     Discharge diagnosis: Term Pregnancy Delivered                                                                                                Post partum procedures:none  Augmentation: none  Complications: None  Hospital course:  Onset of Labor With Vaginal Delivery     20 y.o. yo [id number] at [redacted]w[redacted]d was admitted in Latent Labor on 09/28/2015. Patient had an uncomplicated labor course as follows:  Membrane Rupture Time/Date: 11:00 AM ,09/28/2015   Intrapartum Procedures: Episiotomy: None [1]                                         Lacerations:  None [1]  Patient had a delivery of a Viable infant. 09/29/2015  Information for the patient's newborn:  Hiller, Girl Cynda [[id number]]  Delivery Method: Vaginal, Spontaneous Delivery (Filed from Delivery Summary)     Pateint had an uncomplicated postpartum course.  She is ambulating, tolerating a regular diet, passing flatus, and urinating well. Patient is discharged home in stable condition on 10/01/2015.    Physical exam  Filed Vitals:   09/29/15 1731 09/30/15 0612 09/30/15 1728 10/01/15 0507  BP: 112/55 109/64 119/62 111/54  Pulse: 74 79 88 84  Temp: 97.6 F (36.4 C) 97.8 F (36.6 C) 97.9 F (36.6 C) 97.8 F (36.6 C)  TempSrc: Oral Oral Oral Oral  Resp: 20 18 20 16  Height:      Weight:      SpO2:  100%  100%   General: alert, cooperative and no distress Uterine Fundus: firm Incision: N/A DVT Evaluation: No evidence of DVT seen on physical exam. Negative Homan's sign. No cords or calf tenderness. No significant  calf/ankle edema. Labs: Lab Results  Component Value Date   WBC 10.8* 09/28/2015   HGB 11.8* 09/28/2015   HCT 36.3 09/28/2015   MCV 85.8 09/28/2015   PLT 238 09/28/2015   CMP Latest Ref Rng 09/28/2015  Glucose 65 - 99 mg/dL 84  BUN 6 - 20 mg/dL 10  Creatinine 0.44 - 1.00 mg/dL 0.55  Sodium 135 - 145 mmol/L 133(L)  Potassium 3.5 - 5.1 mmol/L 2.7(LL)  Chloride 101 - 111 mmol/L 100(L)  CO2 22 - 32 mmol/L 22  Calcium 8.9 - 10.3 mg/dL 8.6(L)  Total Protein 6.5 - 8.1 g/dL 7.1  Total Bilirubin 0.3 - 1.2 mg/dL 0.9  Alkaline Phos 38 - 126 U/L 163(H)  AST 15 - 41 U/L 30  ALT 14 - 54 U/L 22    Discharge instruction: per After Visit Summary and "Baby and Me Booklet".    After visit meds:    Medication List    TAKE these medications        acetaminophen 325 MG tablet  Commonly known as:  TYLENOL  Take 2 tablets (650 mg total) by mouth every 4 (four) hours as needed (for pain scale < 4).     ibuprofen 600 MG tablet  Commonly known as:  ADVIL,MOTRIN  Take 1 tablet (600 mg total) by mouth every 6 (six) hours as needed for mild pain or moderate pain.     prenatal multivitamin Tabs tablet  Take 1 tablet by mouth daily.     senna-docusate 8.6-50 MG tablet  Commonly known as:  Senokot-S  Take 1 tablet by mouth at bedtime.        Diet: routine diet  Activity: Advance as tolerated. Pelvic rest for 6 weeks.   Outpatient follow up:6 weeks Follow up Appt:No future appointments. Follow up Visit:No Follow-up on file.  Postpartum contraception: IUD Paragard  Newborn Data: Live born female  Birth Weight: 5 lb 11.9 oz (2605 g) APGAR: 8, 9  Baby Feeding: Breast Disposition:home with mother   10/01/2015 Kanishka G Gunadasa, MD    

## 2020-07-05 ENCOUNTER — Ambulatory Visit (INDEPENDENT_AMBULATORY_CARE_PROVIDER_SITE_OTHER): Payer: Medicaid Other | Admitting: Obstetrics and Gynecology

## 2020-07-05 ENCOUNTER — Encounter: Payer: Self-pay | Admitting: Obstetrics and Gynecology

## 2020-07-05 DIAGNOSIS — Z3A24 24 weeks gestation of pregnancy: Secondary | ICD-10-CM

## 2020-07-09 ENCOUNTER — Encounter: Payer: Self-pay | Admitting: Obstetrics & Gynecology

## 2020-07-09 NOTE — Progress Notes (Signed)
Patient not seen.

## 2020-07-10 ENCOUNTER — Ambulatory Visit (INDEPENDENT_AMBULATORY_CARE_PROVIDER_SITE_OTHER): Payer: Medicaid Other | Admitting: Certified Nurse Midwife

## 2020-07-10 ENCOUNTER — Encounter: Payer: Self-pay | Admitting: Certified Nurse Midwife

## 2020-07-10 ENCOUNTER — Other Ambulatory Visit: Payer: Self-pay

## 2020-07-10 ENCOUNTER — Other Ambulatory Visit (HOSPITAL_COMMUNITY)
Admission: RE | Admit: 2020-07-10 | Discharge: 2020-07-10 | Disposition: A | Discharge status: Home / Self Care | Payer: Medicaid Other | Source: Ambulatory Visit | Attending: Certified Nurse Midwife | Admitting: Certified Nurse Midwife

## 2020-07-10 VITALS — BP 100/68 | HR 88 | Wt 220.0 lb

## 2020-07-10 DIAGNOSIS — Z3480 Encounter for supervision of other normal pregnancy, unspecified trimester: Secondary | ICD-10-CM | POA: Insufficient documentation

## 2020-07-10 DIAGNOSIS — B373 Candidiasis of vulva and vagina: Secondary | ICD-10-CM

## 2020-07-10 DIAGNOSIS — B3731 Acute candidiasis of vulva and vagina: Secondary | ICD-10-CM

## 2020-07-10 DIAGNOSIS — N898 Other specified noninflammatory disorders of vagina: Secondary | ICD-10-CM

## 2020-07-10 DIAGNOSIS — Z3A25 25 weeks gestation of pregnancy: Secondary | ICD-10-CM | POA: Diagnosis not present

## 2020-07-10 DIAGNOSIS — O23592 Infection of other part of genital tract in pregnancy, second trimester: Secondary | ICD-10-CM

## 2020-07-10 DIAGNOSIS — O99210 Obesity complicating pregnancy, unspecified trimester: Secondary | ICD-10-CM | POA: Insufficient documentation

## 2020-07-10 DIAGNOSIS — O0930 Supervision of pregnancy with insufficient antenatal care, unspecified trimester: Secondary | ICD-10-CM | POA: Insufficient documentation

## 2020-07-10 DIAGNOSIS — Z8659 Personal history of other mental and behavioral disorders: Secondary | ICD-10-CM

## 2020-07-10 DIAGNOSIS — A5901 Trichomonal vulvovaginitis: Secondary | ICD-10-CM

## 2020-07-10 DIAGNOSIS — Z23 Encounter for immunization: Secondary | ICD-10-CM | POA: Diagnosis not present

## 2020-07-10 DIAGNOSIS — B009 Herpesviral infection, unspecified: Secondary | ICD-10-CM | POA: Insufficient documentation

## 2020-07-10 DIAGNOSIS — O98519 Other viral diseases complicating pregnancy, unspecified trimester: Secondary | ICD-10-CM

## 2020-07-10 MED ORDER — ASPIRIN EC 81 MG PO TBEC: 81.0000 mg | DELAYED_RELEASE_TABLET | Freq: Every day | ORAL | 0 refills | Status: DC

## 2020-07-10 NOTE — Patient Instructions (Signed)
Genetic Testing During Pregnancy Genetic testing during pregnancy is also called prenatal genetic testing. This type of testing can determine if your baby is at risk of being born with a disorder caused by abnormal genes or chromosomes (genetic disorder). Chromosomes contain genes that control how your baby will develop in your womb. There are many different genetic disorders. Examples of genetic disorders that may be found through genetic testing include Down syndrome and cystic fibrosis. Gene changes (mutations) can be passed down through families. Genetic testing is offered to all women before or during pregnancy. You can choose whether to have genetic testing. Why is genetic testing done? Genetic testing is done during pregnancy to find out whether your child is at risk for a genetic disorder. Having genetic testing allows you to:  Discuss your test results and options with a genetic counselor.  Prepare for a baby that may be born with a genetic disorder. Learning about the disorder ahead of time helps you be better prepared to manage it. Your health care providers can also be prepared in case your baby requires special care before or after birth.  Consider whether you want to continue with the pregnancy. In some cases, genetic testing may be done to learn about the traits a child will inherit. Types of genetic tests There are two basic types of genetic testing. Screening tests indicate whether your developing baby (fetus) is at higher risk for a genetic disorder. Diagnostic tests check actual fetal cells to diagnose a genetic disorder. Screening tests     Screening tests will not harm your baby. They are recommended for all pregnant women. Types of screening tests include:  Carrier screening. This test involves checking genes from both parents by testing their blood or saliva. The test checks to find out if the parents carry a genetic mutation that may be passed to a baby. In most cases,  both parents must carry the mutation for a baby to be at risk.  First trimester screening. This test combines a blood test with sound wave imaging of your baby (fetal ultrasound). This screening test checks for a risk of Down syndrome or other defects caused by having extra chromosomes. It also checks for defects of the heart, abdomen, or skeleton.  Second trimester screening also combines a blood test with a fetal ultrasound exam. It checks for a risk of genetic defects of the face, brain, spine, heart, or limbs.  Combined or sequential screening. This type of testing combines the results of first and second trimester screening. This type of testing may be more accurate than first or second trimester screening alone.  Cell-free DNA testing. This is a blood test that detects cells released by the placenta that get into the mother's blood. It can be used to check for a risk of Down syndrome, other extra chromosome syndromes, and disorders caused by abnormal numbers of sex chromosomes. This test can be done any time after 10 weeks of pregnancy.  Diagnostic tests Diagnostic tests carry slight risks of problems, including bleeding, infection, and loss of the pregnancy. These tests are done only if your baby is at risk for a genetic disorder. You may meet with a genetic counselor to discuss the risks and benefits before having diagnostic tests. Examples of diagnostic tests include:  Chorionic villus sampling (CVS). This involves a procedure to remove and test a sample of cells taken from the placenta. The procedure may be done between 10 and 12 weeks of pregnancy.  Amniocentesis. This involves a   procedure to remove and test a sample of fluid (amniotic fluid) and cells from the sac that surrounds the developing baby. The procedure may be done between 15 and 20 weeks of pregnancy. What do the results mean? For a screening test:  If the results are negative, it often means that your child is not at higher  risk. There is still a slight chance your child could have a genetic disorder.  If the results are positive, it does not mean your child will have a genetic disorder. It may mean that your child has a higher-than-normal risk for a genetic disorder. In that case, you may want to talk with a genetic counselor about whether you should have diagnostic genetic tests. For a diagnostic test:  If the result is negative, it is unlikely that your child will have a genetic disorder.  If the test is positive for a genetic disorder, it is likely that your child will have the disorder. The test may not tell how severe the disorder will be. Talk with your health care provider about your options. Questions to ask your health care provider Before talking to your health care provider about genetic testing, find out if there is a history of genetic disorders in your family. It may also help to know your family's ethnic origins. Then ask your health care provider the following questions:  Is my baby at risk for a genetic disorder?  What are the benefits of having genetic screening?  What tests are best for me and my baby?  What are the risks of each test?  If I get a positive result on a screening test, what is the next step?  Should I meet with a genetic counselor before having a diagnostic test?  Should my partner or other members of my family be tested?  How much do the tests cost? Will my insurance cover the testing? Summary  Genetic testing is done during pregnancy to find out whether your child is at risk for a genetic disorder.  Genetic testing is offered to all women before or during pregnancy. You can choose whether to have genetic testing.  There are two basic types of genetic testing. Screening tests indicate whether your developing baby (fetus) is at higher risk for a genetic disorder. Diagnostic tests check actual fetal cells to diagnose a genetic disorder.  If a diagnostic genetic test is  positive, talk with your health care provider about your options. This information is not intended to replace advice given to you by your health care provider. Make sure you discuss any questions you have with your health care provider. Document Revised: 12/30/2018 Document Reviewed: 11/23/2017 Elsevier Patient Education  2020 [employer] Inc[employer]  

## 2020-07-10 NOTE — Progress Notes (Signed)
Vaginal itching

## 2020-07-10 NOTE — Progress Notes (Signed)
History:   Mary [name] is a 24 y.o. [phone] at [redacted]w[redacted]d by LMP being seen today for her first obstetrical visit.  Her obstetrical history is significant for obesity and late for prenatal care. Patient does intend to breast feed. Pregnancy history fully reviewed.  Patient reports no complaints. Patient was seen for initial prenatal visit last week at [facility]. Patient reports not wanted to receive care with them and transferring to our practice.   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 Reports fetal head positioned low- nasal bone difficult to visualize and may be absent. Increased risk for Trisomy 21.      HISTORY: OB History  Gravida Para Term Preterm AB Living  4 3 3  0 0 3  SAB TAB Ectopic Multiple Live Births  0 0 0 0 3    # Outcome Date GA Lbr Len/2nd Weight Sex Delivery Anes PTL Lv  4 Current           3 Term 09/29/15 [redacted]w[redacted]d 16:52 / 00:05 5 lb 11.9 oz (2.605 kg) F Vag-Spont None  LIV     Name: Mary [name]     Apgar1: 8  Apgar5: 9  2 Term 10/01/11 [redacted]w[redacted]d 644:11 / 00:15 7 lb 3.3 oz (3.27 kg) M Vag-Spont EPI  LIV     Name: Mary [name]     Apgar1: 8  Apgar5: 8  1 Term             Last pap smear was done 09/2019 and was abnormal - ASCUS/HPV  Past Medical History:  Diagnosis Date  . Anxiety   . Asthma    as a child  . Depression   . History of chlamydia   . Trichomonas    Past Surgical History:  Procedure Laterality Date  . NO PAST SURGERIES     Family History  Problem Relation Age of Onset  . Cancer Maternal Grandmother   . Stroke Maternal Grandmother   . Depression Mother   . Bipolar disorder Mother   . Diabetes type II Mother   . Seizures Mother   . Hypertension Mother   . Obesity Mother   . Diabetes Mother   . Mental illness Mother   . Cancer Maternal Uncle    Social History   Tobacco Use  . Smoking status: Never Smoker  . Smokeless tobacco:  Never Used  Vaping Use  . Vaping Use: Never used  Substance Use Topics  . Alcohol use: No  . Drug use: No   No Known Allergies Current Outpatient Medications on File Prior to Visit  Medication Sig Dispense Refill  . acetaminophen (TYLENOL) 325 MG tablet Take 2 tablets (650 mg total) by mouth every 4 (four) hours as needed (for pain scale < 4). 90 tablet 0  . Prenatal Vit-Fe Fumarate-FA (PRENATAL MULTIVITAMIN) TABS Take 1 tablet by mouth daily.      10/2019 senna-docusate (SENOKOT-S) 8.6-50 MG tablet Take 1 tablet by mouth at bedtime. (Patient not taking: Reported on 07/10/2020) 60 tablet 0   No current facility-administered medications on file prior to visit.    Review of Systems Pertinent items noted in HPI and remainder of comprehensive ROS otherwise negative. Physical Exam:   Vitals:   07/10/20 1048  BP: 100/68  Pulse: 88  Weight: 220 lb (99.8 kg)   Fetal Heart Rate (bpm): 149  Uterus:  Fundal Height: 27 cm  System: General: well-developed, obese  female in no acute distress   Skin: normal coloration and turgor, no rashes   Neurologic: oriented, normal, negative, normal mood   Extremities: normal strength, tone, and muscle mass, ROM of all joints is normal   HEENT PERRLA, extraocular movement intact and sclera clear, anicteric   Mouth/Teeth mucous membranes moist, pharynx normal without lesions and dental hygiene good   Neck supple and no masses   Cardiovascular: regular rate and rhythm   Respiratory:  no respiratory distress, normal breath sounds   Abdomen: soft, non-tender; bowel sounds normal; no masses,  no organomegaly    Assessment:    Pregnancy: [id number] Patient Active Problem List   Diagnosis Date Noted  . Supervision of other normal pregnancy, antepartum 07/10/2020  . Herpes simplex type 2 (HSV-2) infection affecting pregnancy, antepartum 07/10/2020  . Late prenatal care affecting pregnancy, antepartum 07/10/2020  . Active labor at term 09/28/2015  . Adolescent  depression 11/05/2011  . 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 [redacted] weeks gestation of pregnancy - Flu Vaccine QUAD 36+ mos IM - HIV antibody (with reflex)  2. Supervision of other normal pregnancy, antepartum - Welcomed to practice and introduced self to patient  - Routine prenatal care - Anticipatory guidance on upcoming appointments including GTT at next visit  - Discussed with patient importance of fasting after MN prior to GTT, patient verbalizes understanding - Obstetric panel - Hepatitis C Antibody - Culture, OB Urine - Genetic Screening - Hemoglobinopathy Evaluation - Cervicovaginal ancillary only( [place]) - [place] MFM OB DETAIL +14 WK; Future  3. Herpes simplex type 2 (HSV-2) infection affecting pregnancy, antepartum - suppression at 35/[redacted] weeks gestation   4. Late prenatal care affecting pregnancy, antepartum - care initiated at 24.5 weeks  - [place] MFM OB DETAIL +14 WK; Future  5. Vaginal irritation - Patient reports vaginal irritation without discharge for the past 2 weeks  - Patient requested to do self swab  - Will manage accordingly after results   6. History of anxiety and depression - Not currently on medication for anxiety or depression  - increase anxiety due to possibility of Trisomy 21 infant  - Ambulatory referral to Integrated Behavioral Health  7. Obesity during pregnancy, antepartum - pregravid BMI 34.11 - aspirin EC 81 MG tablet; Take 1 tablet (81 mg total) by mouth daily. Take after 12 weeks for prevention of preeclampsia later in pregnancy  Dispense: 300 tablet; Refill: 0   Initial labs drawn. Continue prenatal vitamins. Problem list reviewed and updated. Genetic Screening discussed, NIPS: ordered. Ultrasound discussed; fetal anatomic survey: ordered. Anticipatory guidance about prenatal visits given including labs, ultrasounds, and testing. Discussed usage of Babyscripts and virtual visits as additional source of managing  and completing prenatal visits in midst of coronavirus and pandemic.   Encouraged to complete MyChart Registration for her ability to review results, send requests, and have questions addressed.  The nature of [place] [facility] with multiple MDs and Advanced Practice Providers was explained to patient; also emphasized that residents, students are part of our team. Routine obstetric precautions reviewed. Encouraged to seek out care at office or emergency room [facility] preferred) for urgent and/or emergent concerns. Return in about 3 weeks (around 07/31/2020) for ROB/GTT.     Sharyon Cable, CNM [facility] [employer], [facility]

## 2020-07-11 LAB — CERVICOVAGINAL ANCILLARY ONLY
Bacterial Vaginitis (gardnerella): NEGATIVE
Candida Glabrata: NEGATIVE
Candida Vaginitis: POSITIVE — AB
Chlamydia: NEGATIVE
Comment: NEGATIVE
Comment: NEGATIVE
Comment: NEGATIVE
Comment: NEGATIVE
Comment: NEGATIVE
Comment: NORMAL
Neisseria Gonorrhea: NEGATIVE
Trichomonas: POSITIVE — AB

## 2020-07-11 LAB — OBSTETRIC PANEL
Absolute Monocytes: 387 cells/uL (ref 200–950)
Eosinophils Absolute: 114 cells/uL (ref 15–500)
Eosinophils Relative: 1.3 %
Lymphs Abs: 1276 cells/uL (ref 850–3900)
Neutrophils Relative %: 79.5 %

## 2020-07-11 LAB — HEPATITIS C ANTIBODY: Hepatitis C Ab: NONREACTIVE

## 2020-07-11 LAB — HIV ANTIBODY (ROUTINE TESTING W REFLEX): HIV 1&2 Ab, 4th Generation: NONREACTIVE

## 2020-07-12 ENCOUNTER — Encounter: Payer: Self-pay | Admitting: Certified Nurse Midwife

## 2020-07-12 ENCOUNTER — Telehealth: Payer: Self-pay

## 2020-07-12 DIAGNOSIS — O23592 Infection of other part of genital tract in pregnancy, second trimester: Secondary | ICD-10-CM | POA: Insufficient documentation

## 2020-07-12 DIAGNOSIS — A5901 Trichomonal vulvovaginitis: Secondary | ICD-10-CM | POA: Insufficient documentation

## 2020-07-12 LAB — URINE CULTURE, OB REFLEX

## 2020-07-12 LAB — CULTURE, OB URINE

## 2020-07-12 MED ORDER — TERCONAZOLE 0.4 % VA CREA: 1.0000 | TOPICAL_CREAM | Freq: Every day | VAGINAL | 0 refills | Status: DC

## 2020-07-12 MED ORDER — METRONIDAZOLE 500 MG PO TABS: 2000.0000 mg | ORAL_TABLET | Freq: Once | ORAL | 0 refills | Status: AC

## 2020-07-12 NOTE — Addendum Note (Signed)
Addended by: Sharyon Cable on: 07/12/2020 08:55 AM   Modules accepted: Orders

## 2020-07-12 NOTE — Telephone Encounter (Addendum)
Spoke with pt and she is positive of yeast and trich results. Pt is aware her medication is at pharmacy. Pt is aware her partner needs to go be seen and treated and to abstain from intercourse for 7-10 days after treatment. Pt expressed understanding.   ----- Message from Sharyon Cable, CNM sent at 07/12/2020  8:55 AM EDT ----- Please call patient and notify of trichomonas and yeast infection. Medication for treatment has been sent to pharmacy. Partner needs to go to HD or PCP for treatment and to abstain from IC for 7-10 days after he is treated.   Sharyon Cable, CNM 07/12/20, 8:53 AM

## 2020-07-16 LAB — OBSTETRIC PANEL
Antibody Screen: NOT DETECTED
Basophils Absolute: 26 cells/uL (ref 0–200)
Basophils Relative: 0.3 %
HCT: 34.5 % — ABNORMAL LOW (ref 35.0–45.0)
Hemoglobin: 11.3 g/dL — ABNORMAL LOW (ref 11.7–15.5)
Hepatitis B Surface Ag: NONREACTIVE
MCH: 27.2 pg (ref 27.0–33.0)
MCHC: 32.8 g/dL (ref 32.0–36.0)
MCV: 83.1 fL (ref 80.0–100.0)
MPV: 12.4 fL (ref 7.5–12.5)
Monocytes Relative: 4.4 %
Neutro Abs: 6996 cells/uL (ref 1500–7800)
Platelets: 166 10*3/uL (ref 140–400)
RBC: 4.15 10*6/uL (ref 3.80–5.10)
RDW: 13.6 % (ref 11.0–15.0)
RPR Ser Ql: NONREACTIVE
Rubella: 1.6 Index
Total Lymphocyte: 14.5 %
WBC: 8.8 10*3/uL (ref 3.8–10.8)

## 2020-07-16 LAB — HEMOGLOBINOPATHY EVALUATION
Fetal Hemoglobin Testing: <1 % (ref 0.0–1.9)
HCT: 35.9 % (ref 35.0–45.0)
Hemoglobin A2 - HGBRFX: 2.3 % (ref 2.2–3.2)
Hemoglobin: 11.2 g/dL — ABNORMAL LOW (ref 11.7–15.5)
Hgb A: 97.7 % (ref >96.0)
MCH: 27.1 pg (ref 27.0–33.0)
MCV: 86.7 fL (ref 80.0–100.0)
RBC: 4.14 10*6/uL (ref 3.80–5.10)
RDW: 13.6 % (ref 11.0–15.0)

## 2020-07-16 LAB — HEPATITIS C ANTIBODY: SIGNAL TO CUT-OFF: 0.01 (ref <1.00)

## 2020-07-17 ENCOUNTER — Encounter: Payer: Self-pay | Admitting: General Practice

## 2020-07-18 ENCOUNTER — Telehealth: Payer: Self-pay

## 2020-07-18 DIAGNOSIS — Z3480 Encounter for supervision of other normal pregnancy, unspecified trimester: Secondary | ICD-10-CM

## 2020-07-18 NOTE — Telephone Encounter (Signed)
Spoke with pt and she is aware of low risk [place] results. Pt is also aware the gender results are female. Pt understands we are still waiting on [facility] results. Pt state she felt some stomach/pelvic pain yesterday but, it has improved today. I told pt it could be round ligament pain and if it seems to get any worse then she can call office. Pt expressed understanding.

## 2020-07-23 ENCOUNTER — Encounter: Payer: Self-pay | Admitting: *Deleted

## 2020-07-23 ENCOUNTER — Other Ambulatory Visit: Payer: Self-pay

## 2020-07-23 ENCOUNTER — Other Ambulatory Visit: Payer: Self-pay | Admitting: *Deleted

## 2020-07-23 ENCOUNTER — Ambulatory Visit: Payer: Medicaid Other | Attending: Certified Nurse Midwife

## 2020-07-23 ENCOUNTER — Ambulatory Visit: Payer: Medicaid Other | Admitting: *Deleted

## 2020-07-23 DIAGNOSIS — Z3480 Encounter for supervision of other normal pregnancy, unspecified trimester: Secondary | ICD-10-CM

## 2020-07-23 DIAGNOSIS — O99210 Obesity complicating pregnancy, unspecified trimester: Secondary | ICD-10-CM | POA: Insufficient documentation

## 2020-07-23 DIAGNOSIS — A5901 Trichomonal vulvovaginitis: Secondary | ICD-10-CM | POA: Diagnosis present

## 2020-07-23 DIAGNOSIS — Z362 Encounter for other antenatal screening follow-up: Secondary | ICD-10-CM

## 2020-07-23 DIAGNOSIS — O23592 Infection of other part of genital tract in pregnancy, second trimester: Secondary | ICD-10-CM

## 2020-07-23 DIAGNOSIS — O0930 Supervision of pregnancy with insufficient antenatal care, unspecified trimester: Secondary | ICD-10-CM | POA: Insufficient documentation

## 2020-07-31 ENCOUNTER — Encounter: Payer: Medicaid Other | Admitting: Advanced Practice Midwife

## 2020-07-31 DIAGNOSIS — Z3A28 28 weeks gestation of pregnancy: Secondary | ICD-10-CM

## 2020-08-03 ENCOUNTER — Encounter: Payer: Medicaid Other | Admitting: Certified Nurse Midwife

## 2020-08-08 ENCOUNTER — Encounter: Payer: Self-pay | Admitting: Obstetrics and Gynecology

## 2020-08-08 ENCOUNTER — Encounter: Payer: Medicaid Other | Admitting: Obstetrics and Gynecology

## 2020-08-08 ENCOUNTER — Other Ambulatory Visit: Payer: Self-pay

## 2020-08-08 ENCOUNTER — Other Ambulatory Visit (INDEPENDENT_AMBULATORY_CARE_PROVIDER_SITE_OTHER): Payer: Medicaid Other

## 2020-08-08 ENCOUNTER — Encounter (INDEPENDENT_AMBULATORY_CARE_PROVIDER_SITE_OTHER): Payer: Medicaid Other | Admitting: Obstetrics and Gynecology

## 2020-08-08 ENCOUNTER — Ambulatory Visit (INDEPENDENT_AMBULATORY_CARE_PROVIDER_SITE_OTHER): Payer: Medicaid Other | Admitting: Obstetrics and Gynecology

## 2020-08-08 VITALS — BP 122/80 | HR 105 | Wt 224.0 lb

## 2020-08-08 DIAGNOSIS — Z23 Encounter for immunization: Secondary | ICD-10-CM

## 2020-08-08 DIAGNOSIS — Z3A29 29 weeks gestation of pregnancy: Secondary | ICD-10-CM | POA: Diagnosis not present

## 2020-08-08 DIAGNOSIS — T391X1A Poisoning by 4-Aminophenol derivatives, accidental (unintentional), initial encounter: Secondary | ICD-10-CM | POA: Insufficient documentation

## 2020-08-08 DIAGNOSIS — Z3480 Encounter for supervision of other normal pregnancy, unspecified trimester: Secondary | ICD-10-CM

## 2020-08-08 DIAGNOSIS — Z8659 Personal history of other mental and behavioral disorders: Secondary | ICD-10-CM

## 2020-08-08 MED ORDER — METRONIDAZOLE 500 MG PO TABS: 1000.0000 mg | ORAL_TABLET | Freq: Once | ORAL | 0 refills | Status: AC

## 2020-08-08 NOTE — Progress Notes (Signed)
   PRENATAL VISIT NOTE  Subjective:  Mary [name] is a 24 y.o. [phone] at [redacted]w[redacted]d being seen today for ongoing prenatal care.  She is currently monitored for the following issues for this low-risk pregnancy and has Adolescent depression; Anxiety disorder of adolescence; Active labor at term; Supervision of other normal pregnancy, antepartum; Herpes simplex type 2 (HSV-2) infection affecting pregnancy, antepartum; Late prenatal care affecting pregnancy, antepartum; Obesity during pregnancy, antepartum; Trichomonal vaginitis during pregnancy in second trimester; and Unintentional Tylenol overdose on their problem list.  Patient reports no complaints.  Contractions: Not present. Vag. Bleeding: None.  Movement: Present. Denies leaking of fluid.   The following portions of the patient's history were reviewed and updated as appropriate: allergies, current medications, past family history, past medical history, past social history, past surgical history and problem list.   Objective:   Vitals:   08/08/20 1032  BP: 122/80  Pulse: (!) 105  Weight: 224 lb (101.6 kg)    Fetal Status: Fetal Heart Rate (bpm): 141 Fundal Height: 28 cm Movement: Present     General:  Alert, oriented and cooperative. Patient is in no acute distress.  Skin: Skin is warm and dry. No rash noted.   Cardiovascular: Normal heart rate noted  Respiratory: Normal respiratory effort, no problems with respiration noted  Abdomen: Soft, gravid, appropriate for gestational age.  Pain/Pressure: Present     Pelvic: Cervical exam deferred        Extremities: Normal range of motion.  Edema: None  Mental Status: Normal mood and affect. Normal behavior. Normal judgment and thought content.   Assessment and Plan:  Pregnancy: G4P3003 at [redacted]w[redacted]d   1. [redacted] weeks gestation of pregnancy  Partner did not take treatment for trichomonas and they have been sexually active.  Rx: Flagyl, advised no intercourse for 7 days once both are treated. TOC  in 2-4 weeks  2. Supervision of other normal pregnancy, antepartum  Rx: Valtrex for suppression 35/36 weeks 2 hour GTT today Elevated liver enzymes due unintentional tylenol overdose. Liver enzymes ordered today.  Ambulatory referral to integrated health.    Preterm labor symptoms and general obstetric precautions including but not limited to vaginal bleeding, contractions, leaking of fluid and fetal movement were reviewed in detail with the patient. Please refer to After Visit Summary for other counseling recommendations.   Return in about 2 weeks (around 08/22/2020), or With a midwife to sign/ review water birth.  Future Appointments  Date Time Provider Department Center  08/21/2020 11:15 AM [facility] NURSE [facility]  08/21/2020 11:30 AM [facility] US3 [facility]  08/24/2020 10:30 AM Donette Larry, CNM [facility]    Venia Carbon, NP

## 2020-08-09 LAB — CBC
HCT: 29.5 % — ABNORMAL LOW (ref 35.0–45.0)
Hemoglobin: 9.5 g/dL — ABNORMAL LOW (ref 11.7–15.5)
MCH: 27.9 pg (ref 27.0–33.0)
MCHC: 32.2 g/dL (ref 32.0–36.0)
MCV: 86.5 fL (ref 80.0–100.0)
MPV: 11.6 fL (ref 7.5–12.5)
Platelets: 207 10*3/uL (ref 140–400)
RBC: 3.41 10*6/uL — ABNORMAL LOW (ref 3.80–5.10)
RDW: 16.6 % — ABNORMAL HIGH (ref 11.0–15.0)
WBC: 9.2 10*3/uL (ref 3.8–10.8)

## 2020-08-09 LAB — 2HR GTT W 1 HR, CARPENTER, 75 G
Glucose, 1 Hr, Gest: 92 mg/dL (ref 65–179)
Glucose, 2 Hr, Gest: 74 mg/dL (ref 65–152)
Glucose, Fasting, Gest: 69 mg/dL (ref 65–91)

## 2020-08-09 LAB — COMPREHENSIVE METABOLIC PANEL
AG Ratio: 1.5 (calc) (ref 1.0–2.5)
ALT: 58 U/L — ABNORMAL HIGH (ref 6–29)
AST: 24 U/L (ref 10–30)
Albumin: 3.7 g/dL (ref 3.6–5.1)
Alkaline phosphatase (APISO): 106 U/L (ref 31–125)
BUN/Creatinine Ratio: 15 (calc) (ref 6–22)
BUN: 7 mg/dL (ref 7–25)
CO2: 21 mmol/L (ref 20–32)
Calcium: 8.8 mg/dL (ref 8.6–10.2)
Chloride: 107 mmol/L (ref 98–110)
Creat: 0.47 mg/dL — ABNORMAL LOW (ref 0.50–1.10)
Globulin: 2.4 g/dL (calc) (ref 1.9–3.7)
Glucose, Bld: 71 mg/dL (ref 65–99)
Potassium: 3.5 mmol/L (ref 3.5–5.3)
Sodium: 138 mmol/L (ref 135–146)
Total Bilirubin: 0.6 mg/dL (ref 0.2–1.2)
Total Protein: 6.1 g/dL (ref 6.1–8.1)

## 2020-08-09 LAB — RPR: RPR Ser Ql: NONREACTIVE

## 2020-08-09 LAB — HEPATIC FUNCTION PANEL
AG Ratio: 1.5 (calc) (ref 1.0–2.5)
ALT: 58 U/L — ABNORMAL HIGH (ref 6–29)
AST: 24 U/L (ref 10–30)
Albumin: 3.7 g/dL (ref 3.6–5.1)
Alkaline phosphatase (APISO): 106 U/L (ref 31–125)
Bilirubin, Direct: 0.2 mg/dL (ref 0.0–0.2)
Globulin: 2.4 g/dL (calc) (ref 1.9–3.7)
Indirect Bilirubin: 0.4 mg/dL (calc) (ref 0.2–1.2)
Total Bilirubin: 0.6 mg/dL (ref 0.2–1.2)
Total Protein: 6.1 g/dL (ref 6.1–8.1)

## 2020-08-09 LAB — HIV ANTIBODY (ROUTINE TESTING W REFLEX): HIV 1&2 Ab, 4th Generation: NONREACTIVE

## 2020-08-10 ENCOUNTER — Encounter: Payer: Medicaid Other | Admitting: Certified Nurse Midwife

## 2020-08-10 MED ORDER — VALACYCLOVIR HCL 500 MG PO TABS: 500.0000 mg | ORAL_TABLET | Freq: Two times a day (BID) | ORAL | 1 refills | Status: DC

## 2020-08-10 NOTE — Progress Notes (Signed)
See note

## 2020-08-12 ENCOUNTER — Encounter: Payer: Self-pay | Admitting: Obstetrics and Gynecology

## 2020-08-12 DIAGNOSIS — O99019 Anemia complicating pregnancy, unspecified trimester: Secondary | ICD-10-CM | POA: Insufficient documentation

## 2020-08-13 NOTE — BH Specialist Note (Addendum)
Integrated Behavioral Health via Telemedicine Phone Visit  08/13/2020 Mary [name] [id number]  Number of Integrated Behavioral Health visits: 1 Session Start time: 2:35  Session End time: 3:05 Total time: 30  Referring Provider: Venia Carbon, NP Patient/Family location: In parked car; [place], [place] [facility] Provider location: [facility] [employer] [facility] [employer] [facility]  All persons participating in visit: [name] and [facility] Mary [name]   Types of Service: Individual psychotherapy  I connected with Mary [name]  and verified that I am speaking with the correct person using two identifiers.    Discussed confidentiality: Yes   I discussed the limitations of telemedicine and the availability of in person appointments.  Discussed there is a possibility of technology failure and discussed alternative modes of communication if that failure occurs.  I discussed that engaging in this telemedicine visit, they consent to the provision of behavioral healthcare and the services will be billed under their insurance.  Patient and/or legal guardian expressed understanding and consented to Telemedicine visit: Yes   Presenting Concerns: Patient and/or family reports the following symptoms/concerns: Pt states her primary symptoms are  Duration of problem: Ongoing; Severity of problem: moderately severe  Patient and/or Family's Strengths/Protective Factors: Concrete supports in place (healthy food, safe environments, etc.)  Goals Addressed: Patient will: 1.  Reduce symptoms of: anxiety, depression and stress  2.  Increase knowledge and/or ability of: healthy habits and stress reduction  3.  Demonstrate ability to: Increase healthy adjustment to current life circumstances and Increase motivation to adhere to plan of care  Progress towards Goals: Ongoing  Interventions: Interventions utilized:  Solution-Focused Strategies and Psychoeducation and/or  Health Education Standardized Assessments completed: GAD-7 and PHQ 9  Patient and/or Family Response: Pt agrees to treatment plan  Assessment: Patient currently experiencing Anxiety disorder, unspecified.   Patient may benefit from psychoeducation and brief therapeutic interventions regarding coping with symptoms of anxiety, depression, and stress .  Plan: 1. Follow up with behavioral health clinician on : Two weeks 2. Behavioral recommendations:  -Continue taking prenatal vitamin and iron, as prescribed  -Set up MyChart today -Read educational materials regarding coping with symptoms of anxiety with panic  -Consider using apps (on AVS) as additional self-care 3. Referral(s): Integrated [employer] (In Clinic)  I discussed the assessment and treatment plan with the patient and/or parent/guardian. They were provided an opportunity to ask questions and all were answered. They agreed with the plan and demonstrated an understanding of the instructions.   They were advised to call back or seek an in-person evaluation if the symptoms worsen or if the condition fails to improve as anticipated.  Rae Lips, LCSW   Depression screen [facility] 2/9 08/27/2020  Decreased Interest 2  Down, Depressed, Hopeless 1  PHQ - 2 Score 3  Altered sleeping 3  Tired, decreased energy 3  Change in appetite 3  Feeling bad or failure about yourself  1  Trouble concentrating 1  Moving slowly or fidgety/restless 1  Suicidal thoughts 0  PHQ-9 Score 15   GAD 7 : Generalized Anxiety Score 08/27/2020  Nervous, Anxious, on Edge 3  Control/stop worrying 3  Worry too much - different things 3  Trouble relaxing 3  Restless 1  Easily annoyed or irritable 3  Afraid - awful might happen 1  Total GAD 7 Score 17

## 2020-08-15 ENCOUNTER — Other Ambulatory Visit: Payer: Self-pay | Admitting: Obstetrics and Gynecology

## 2020-08-15 MED ORDER — IRON (FERROUS SULFATE) 325 (65 FE) MG PO TABS: 1.0000 | ORAL_TABLET | Freq: Every day | ORAL | 2 refills | Status: DC

## 2020-08-15 NOTE — Progress Notes (Signed)
i

## 2020-08-21 ENCOUNTER — Other Ambulatory Visit: Payer: Self-pay | Admitting: *Deleted

## 2020-08-21 ENCOUNTER — Ambulatory Visit: Payer: Medicaid Other | Attending: Obstetrics and Gynecology

## 2020-08-21 ENCOUNTER — Other Ambulatory Visit: Payer: Self-pay

## 2020-08-21 ENCOUNTER — Ambulatory Visit: Payer: Medicaid Other | Admitting: *Deleted

## 2020-08-21 ENCOUNTER — Encounter: Payer: Self-pay | Admitting: *Deleted

## 2020-08-21 DIAGNOSIS — A5901 Trichomonal vulvovaginitis: Secondary | ICD-10-CM | POA: Insufficient documentation

## 2020-08-21 DIAGNOSIS — O283 Abnormal ultrasonic finding on antenatal screening of mother: Secondary | ICD-10-CM | POA: Diagnosis not present

## 2020-08-21 DIAGNOSIS — O99213 Obesity complicating pregnancy, third trimester: Secondary | ICD-10-CM | POA: Diagnosis not present

## 2020-08-21 DIAGNOSIS — O288 Other abnormal findings on antenatal screening of mother: Secondary | ICD-10-CM

## 2020-08-21 DIAGNOSIS — O99210 Obesity complicating pregnancy, unspecified trimester: Secondary | ICD-10-CM | POA: Insufficient documentation

## 2020-08-21 DIAGNOSIS — E669 Obesity, unspecified: Secondary | ICD-10-CM | POA: Diagnosis not present

## 2020-08-21 DIAGNOSIS — Z362 Encounter for other antenatal screening follow-up: Secondary | ICD-10-CM

## 2020-08-21 DIAGNOSIS — O23592 Infection of other part of genital tract in pregnancy, second trimester: Secondary | ICD-10-CM

## 2020-08-21 DIAGNOSIS — O0933 Supervision of pregnancy with insufficient antenatal care, third trimester: Secondary | ICD-10-CM

## 2020-08-21 DIAGNOSIS — Z3A31 31 weeks gestation of pregnancy: Secondary | ICD-10-CM

## 2020-08-21 DIAGNOSIS — Z363 Encounter for antenatal screening for malformations: Secondary | ICD-10-CM

## 2020-08-23 ENCOUNTER — Telehealth: Payer: Self-pay | Admitting: *Deleted

## 2020-08-23 NOTE — Telephone Encounter (Signed)
Returned call from 9:20 AM. Left patient a message with appointment day, arrival time, and time of appointment.

## 2020-08-24 ENCOUNTER — Telehealth: Payer: Self-pay | Admitting: Clinical

## 2020-08-24 ENCOUNTER — Other Ambulatory Visit: Payer: Self-pay

## 2020-08-24 ENCOUNTER — Ambulatory Visit (INDEPENDENT_AMBULATORY_CARE_PROVIDER_SITE_OTHER): Payer: Medicaid Other | Admitting: Certified Nurse Midwife

## 2020-08-24 ENCOUNTER — Other Ambulatory Visit (HOSPITAL_COMMUNITY)
Admission: RE | Admit: 2020-08-24 | Discharge: 2020-08-24 | Disposition: A | Discharge status: Home / Self Care | Payer: Medicaid Other | Source: Ambulatory Visit | Attending: Certified Nurse Midwife | Admitting: Certified Nurse Midwife

## 2020-08-24 VITALS — BP 118/86 | HR 88 | Wt 223.0 lb

## 2020-08-24 DIAGNOSIS — O23592 Infection of other part of genital tract in pregnancy, second trimester: Secondary | ICD-10-CM | POA: Diagnosis present

## 2020-08-24 DIAGNOSIS — A5901 Trichomonal vulvovaginitis: Secondary | ICD-10-CM | POA: Diagnosis present

## 2020-08-24 DIAGNOSIS — Z3480 Encounter for supervision of other normal pregnancy, unspecified trimester: Secondary | ICD-10-CM

## 2020-08-24 DIAGNOSIS — B009 Herpesviral infection, unspecified: Secondary | ICD-10-CM

## 2020-08-24 DIAGNOSIS — O98519 Other viral diseases complicating pregnancy, unspecified trimester: Secondary | ICD-10-CM

## 2020-08-24 NOTE — Progress Notes (Signed)
Subjective:  Mary [name] is a 24 y.o. [phone] at [redacted]w[redacted]d being seen today for ongoing prenatal care.  She is currently monitored for the following issues for this low-risk pregnancy and has Adolescent depression; Anxiety disorder of adolescence; Active labor at term; Supervision of other normal pregnancy, antepartum; Herpes simplex type 2 (HSV-2) infection affecting pregnancy, antepartum; Late prenatal care affecting pregnancy, antepartum; Obesity during pregnancy, antepartum; Trichomonal vaginitis during pregnancy in second trimester; Unintentional Tylenol overdose; and Anemia in pregnancy on their problem list.  Patient reports feeling abdominal tightening twice a day, lasts 2 min.  Contractions: Irritability. Vag. Bleeding: None.  Movement: Present. Denies leaking of fluid.   The following portions of the patient's history were reviewed and updated as appropriate: allergies, current medications, past family history, past medical history, past social history, past surgical history and problem list. Problem list updated.  Objective:   Vitals:   08/24/20 1037  BP: 118/86  Pulse: 88  Weight: 223 lb (101.2 kg)    Fetal Status: Fetal Heart Rate (bpm): 147 Fundal Height: 31 cm Movement: Present  Presentation: Vertex  General:  Alert, oriented and cooperative. Patient is in no acute distress.  Skin: Skin is warm and dry. No rash noted.   Cardiovascular: Normal heart rate noted  Respiratory: Normal respiratory effort, no problems with respiration noted  Abdomen: Soft, gravid, appropriate for gestational age. Pain/Pressure: Present     Pelvic: Vag. Bleeding: None Vag D/C Character: Thin   Cervical exam performed Dilation: Closed Effacement (%): 0 Station: Ballotable  Extremities: Normal range of motion.  Edema: None  Mental Status: Normal mood and affect. Normal behavior. Normal judgment and thought content.   Urinalysis:      Assessment and Plan:  Pregnancy: G4P3003 at [redacted]w[redacted]d  1.  Supervision of other normal pregnancy, antepartum - planning waterbirth - consent provided for reading, sign at nv - took class, needs to bring certificate  2. Trichomonal vaginitis during pregnancy in third trimester - took Flagyl, partner also treated - Cervicovaginal ancillary only( [place])  3. Herpes simplex type 2 (HSV-2) infection affecting pregnancy, antepartum - plan suppression at 36 wks  Preterm labor symptoms and general obstetric precautions including but not limited to vaginal bleeding, contractions, leaking of fluid and fetal movement were reviewed in detail with the patient. Please refer to After Visit Summary for other counseling recommendations.  Return in about 2 weeks (around 09/07/2020).   Donette Larry, CNM

## 2020-08-24 NOTE — Telephone Encounter (Signed)
Called pt with a reminder of their upcoming [facility] appointment. Left HIPPA compliant message to call back Jamie at [phone].  [employer]

## 2020-08-24 NOTE — Progress Notes (Signed)
Wants to sign Waterbirth consent

## 2020-08-27 ENCOUNTER — Other Ambulatory Visit: Payer: Self-pay

## 2020-08-27 ENCOUNTER — Ambulatory Visit (INDEPENDENT_AMBULATORY_CARE_PROVIDER_SITE_OTHER): Payer: Medicaid Other | Admitting: Clinical

## 2020-08-27 DIAGNOSIS — F419 Anxiety disorder, unspecified: Secondary | ICD-10-CM

## 2020-08-27 NOTE — Patient Instructions (Signed)
[facility]  [facility] [address] [place], [place] [address] [phone] (main office) [phone] (Ashiah Karpowicz's office)  Coping with Panic Attacks   What is a panic attack?  You may have had a panic attack if you experienced four or more of the symptoms listed below coming on abruptly and peaking in about 10 minutes.  Panic Symptoms   . Pounding heart  . Sweating  . Trembling or shaking  . Shortness of breath  . Feeling of choking  . Chest pain  . Nausea or abdominal distress    . Feeling dizzy, unsteady, lightheaded, or faint  . Feelings of unreality or being detached from yourself  . Fear of losing control or going crazy  . Fear of dying  . Numbness or tingling  . Chills or hot flashes      Panic attacks are sometimes accompanied by avoidance of certain places or situations. These are often situations that would be difficult to escape from or in which help might not be available. Examples might include crowded shopping malls, public transportation, restaurants, or driving.   Why do panic attacks occur?   Panic attacks are the body's alarm system gone awry. All of us have a built-in alarm system, powered by adrenaline, which increases our heart rate, breathing, and blood flow in response to danger. Ordinarily, this 'danger response system' works well. In some people, however, the response is either out of proportion to whatever stress is going on, or may come out of the blue without any stress at all.   For example, if you are walking in the woods and see a bear coming your way, a variety of changes occur in your body to prepare you to either fight the danger or flee from the situation. Your heart rate will increase to get more blood flow around your body, your breathing rate will quicken so that more oxygen is available, and your muscles will tighten in order to be ready to fight or run. You may feel nauseated as blood flow leaves your  stomach area and moves into your limbs. These bodily changes are all essential to helping you survive the dangerous situation. After the danger has passed, your body functions will begin to go back to normal. This is because your body also has a system for "recovering" by bringing your body back down to a normal state when the danger is over.   As you can see, the emergency response system is adaptive when there is, in fact, a "true" or "real" danger (e.g., bear). However, sometimes people find that their emergency response system is triggered in "everyday" situations where there really is no true physical danger (e.g., in a meeting, in the grocery store, while driving in normal traffic, etc.).   What triggers a panic attack?  Sometimes particularly stressful situations can trigger a panic attack. For example, an argument with your spouse or stressors at work can cause a stress response (activating the emergency response system) because you perceive it as threatening or overwhelming, even if there is no direct risk to your survival.  Sometimes panic attacks don't seem to be triggered by anything in particular- they may "come out of the blue". Somehow, the natural "fight or flight" emergency response system has gotten activated when there is no real danger. Why does the body go into "emergency mode" when there is no real danger?   Often, people with panic attacks are frightened or alarmed by the physical sensations of   the emergency response system. First, unexpected physical sensations are experienced (tightness in your chest or some shortness of breath). This then leads to feeling fearful or alarmed by these symptoms ("Something's wrong!", "Am I having a heart attack?", "Am I going to faint?") The mind perceives that there is a danger even though no real danger exists. This, in turn, activates the emergency response system ("fight or flight"), leading to a "full blown" panic attack. In summary, panic  attacks occur when we misinterpret physical symptoms as signs of impending death, craziness, loss of control, embarrassment, or fear of fear. Sometimes you may be aware of thoughts of danger that activate the emergency response system (for example, thinking "I'm having a heart attack" when you feel chest pressure or increased heart rate). At other times, however, you may not be aware of such thoughts. After several incidences of being afraid of physical sensations, anxiety and panic can occur in response to the initial sensations without conscious thoughts of danger. Instead, you just feel afraid or alarmed. In other words, the panic or fear may seem to occur "automatically" without you consciously telling yourself anything.   After having had one or more panic attacks, you may also become more focused on what is going on inside your body. You may scan your body and be more vigilant about noticing any symptoms that might signal the start of a panic attack. This makes it easier for panic attacks to happen again because you pick up on sensations you might otherwise not have noticed, and misinterpret them as something dangerous. A panic attack may then result.      How do I cope with panic attacks?  An important part of overcoming panic attacks involves re-interpreting your body's physical reactions and teaching yourself ways to decrease the physical arousal. This can be done through practicing the cognitive and behavioral interventions below.   Research has found that over half of people who have panic attacks show some signs of hyperventilation or overbreathing. This can produce initial sensations that alarm you and lead to a panic attack. Overbreathing can also develop as part of the panic attack and make the symptoms worse. When people hyperventilate, certain blood vessels in the body become narrower. In particular, the brain may get slightly less oxygen. This can lead to the symptoms of dizziness,  confusion, and lightheadedness that often occur during panic attacks. Other parts of the body may also get a bit less oxygen, which may lead to numbness or tingling in the hands or feet or the sensation of cold, clammy hands. It also may lead the heart to pump harder. Although these symptoms may be frightening and feel unpleasant, it is important to remember that hyperventilating is not dangerous. However, you can help overcome the unpleasantness of overbreathing by practicing Breathing Retraining.   Practice this basic technique three times a day, every day:  . Inhale. With your shoulders relaxed, inhale as slowly and deeply as you can while you count to six. If you can, use your diaphragm to fill your lungs with air.  . Hold. Keep the air in your lungs as you slowly count to four.  . Exhale. Slowly breath out as you count to six.  . Repeat. Do the inhale-hold-exhale cycle several times. Each time you do it, exhale for longer counts.  Like any new skill, Breathing Retraining requires practice. Try practicing this skill twice a day for several minutes. Initially, do not try this technique in specific situations or when you   become frightened or have a panic attack. Begin by practicing in a quiet environment to build up your skill level so that you can later use it in time of "emergency."   2. Decreasing Avoidance  Regardless of whether you can identify why you began having panic attacks or whether they seemed to come out of the blue, the places where you began having panic attacks often can become triggers themselves. It is not uncommon for individuals to begin to avoid the places where they have had panic attacks. Over time, the individual may begin to avoid more and more places, thereby decreasing their activities and often negatively impacting their quality of life. To break the cycle of avoidance, it is important to first identify the places or situations that are being avoided, and then to do some  "relearning."  To begin this intervention, first create a list of locations or situations that you tend to avoid. Then choose an avoided location or situation that you would like to target first. Now develop an "exposure hierarchy" for this situation or location. An "exposure hierarchy" is a list of actions that make you feel anxious in this situation. Order these actions from least to most anxiety-producing. It is often helpful to have the first item on your hierarchy involve thinking or imagining part of the feared/avoided situation.   Here is an example of an exposure hierarchy for decreasing avoidance of the grocery store. Note how it is ordered from the least amount of anxiety (at the top) to the most anxiety (at the bottom):  . Think about going to the grocery store alone.  . Go to the grocery store with a friend or family member.  . Go to the grocery store alone to pick up a few small items (5-10 minutes in the store).  . Shopping for 10-20 minutes in the store alone.  . Doing the shopping for the week by myself (20-30 minutes in the store).   Your homework is to "expose" yourself to the lowest item on your hierarchy and use your breathing relaxation and coping statements (see below) to help you remain in the situation. Practice this several times during the upcoming week. Once you have mastered each item with minimal anxiety, move on to the next higher action on your list.   Cognitive Interventions  1. Identify your negative self-talk Anxious thoughts can increase anxiety symptoms and panic. The first step in changing anxious thinking is to identify your own negative, alarming self-talk. Some common alarming thoughts:  . I'm having a heart attack.            . I must be going crazy. . I think I'm dying. . People will think I'm crazy. . I'm going to pass our.  . Oh no- here it comes.  . I can't stand this.  . I've got to get out of here!  2. Use positive coping statements Changing or  disrupting a pattern of anxious thoughts by replacing them with more calming or supportive statements can help to divert a panic attack. Some common helpful coping statements:  . This is not an emergency.  . I don't like feeling this way, but I can accept it.  . I can feel like this and still be okay.  . This has happened before, and I was okay. I'll be okay this time, too.  . I can be anxious and still deal with this situation.    /Emotional Wellbeing Apps and Websites Here are a few   free apps meant to help you to help yourself.  To find, try searching on the internet to see if the app is offered on Apple/Android devices. If your first choice doesn't come up on your device, the good news is that there are many choices! Play around with different apps to see which ones are helpful to you.    Calm This is an app meant to help increase calm feelings. Includes info, strategies, and tools for tracking your feelings.      Calm Harm  This app is meant to help with self-harm. Provides many 5-minute or 15-min coping strategies for doing instead of hurting yourself.       Healthy Minds Health Minds is a problem-solving tool to help deal with emotions and cope with stress you encounter wherever you are.      MindShift This app can help people cope with anxiety. Rather than trying to avoid anxiety, you can make an important shift and face it.      MY3  MY3 features a support system, safety plan and resources with the goal of offering a tool to use in a time of need.       My Life My Voice  This mood journal offers a simple solution for tracking your thoughts, feelings and moods. Animated emoticons can help identify your mood.       Relax Melodies Designed to help with sleep, on this app you can mix sounds and meditations for relaxation.      Smiling Mind Smiling Mind is meditation made easy: it's a simple tool that helps put a smile on your mind.        Stop, Breathe &  Think  A friendly, simple guide for people through meditations for mindfulness and compassion.  Stop, Breathe and Think Kids Enter your current feelings and choose a "mission" to help you cope. Offers videos for certain moods instead of just sound recordings.       Team Orange The goal of this tool is to help teens change how they think, act, and react. This app helps you focus on your own good feelings and experiences.      The Virtual Hope Box The Virtual Hope Box (VHB) contains simple tools to help patients with coping, relaxation, distraction, and positive thinking.     

## 2020-08-28 LAB — CERVICOVAGINAL ANCILLARY ONLY
Comment: NEGATIVE
Trichomonas: NEGATIVE

## 2020-08-28 NOTE — BH Specialist Note (Signed)
Integrated Behavioral Health via Telemedicine Visit  08/28/2020 Mary [name] [id number]  Number of Integrated Behavioral Health visits: 2 Session Start time: 10:18  Session End time: 11:01 Total time: 69  Referring Provider: Venia Carbon, NP Patient/Family location: [facility] Provider location: [facility]  All persons participating in visit: [name] and Mary [name] Mary [name]   Types of Service: Individual psychotherapy  I connected with Mary [name] and/or Mary [name]'s n/a by Video enabled telemedicine application [employer] and verified that I am speaking with the correct person using two identifiers.    Discussed confidentiality: at previous visit  I discussed the limitations of telemedicine and the availability of in person appointments.  Discussed there is a possibility of technology failure and discussed alternative modes of communication if that failure occurs.  I discussed that engaging in this telemedicine visit, they consent to the provision of behavioral healthcare and the services will be billed under their insurance.  Patient and/or legal guardian expressed understanding and consented to Telemedicine visit: Yes   Presenting Concerns: Patient and/or family reports the following symptoms/concerns: Pt states her primary concern today is feeling anxious regarding paying for school, and talking to her mother about not wanting her to be with her during the entire hospital stay at upcoming birth, and interest in obtaining [facility] care for her mother, and finding her own housing.  Duration of problem: ongoing; Severity of problem: moderately severe  Patient and/or Family's Strengths/Protective Factors: Concrete supports in place (healthy food, safe environments, etc.) and Sense of purpose  Goals Addressed: Patient will: 1.  Reduce symptoms of: anxiety, depression and stress  2.  Demonstrate ability to:  Increase healthy adjustment to current life circumstances  Progress towards Goals: Ongoing  Interventions: Interventions utilized:  Solution-Focused Strategies Standardized Assessments completed: PHQ9/GAD7 given in past two weeks  Patient and/or Family Response: Pt agrees with revised treatment plan  Assessment: Patient currently experiencing Anxiety disorder.   Patient may benefit from continued psychoeducation and brief therapeutic interventions regarding coping with symptoms of anxiety and stress .  Plan: 1. Follow up with behavioral health clinician on : Two weeks 2. Behavioral recommendations:  -Continue taking prenatal vitamin and iron daily, as prescribed -Finish setting up MyChart access today -Read through Postpartum Planner on AVS -Consider setting healthy boundaries regarding visitors during labor (discussion with mother) -Continue working with housing groups to obtain housing -Consider apps (on AVS) as additional self-care -Consider additional BH resources for mother, as needed (listed on After Visit Summary) 3. Referral(s): Integrated Art gallery manager (In Clinic) and Community Resources:  BH help for mother  I discussed the assessment and treatment plan with the patient and/or parent/guardian. They were provided an opportunity to ask questions and all were answered. They agreed with the plan and demonstrated an understanding of the instructions.   They were advised to call back or seek an in-person evaluation if the symptoms worsen or if the condition fails to improve as anticipated.  Valetta Close Icesis Renn, LCSW

## 2020-09-05 ENCOUNTER — Ambulatory Visit (INDEPENDENT_AMBULATORY_CARE_PROVIDER_SITE_OTHER): Payer: Medicaid Other | Admitting: Obstetrics and Gynecology

## 2020-09-05 ENCOUNTER — Other Ambulatory Visit: Payer: Self-pay

## 2020-09-05 ENCOUNTER — Other Ambulatory Visit (HOSPITAL_COMMUNITY)
Admission: RE | Admit: 2020-09-05 | Discharge: 2020-09-05 | Disposition: A | Discharge status: Home / Self Care | Payer: Medicaid Other | Source: Ambulatory Visit | Attending: Obstetrics and Gynecology | Admitting: Obstetrics and Gynecology

## 2020-09-05 VITALS — BP 126/84 | HR 94 | Wt 228.0 lb

## 2020-09-05 DIAGNOSIS — Z3493 Encounter for supervision of normal pregnancy, unspecified, third trimester: Secondary | ICD-10-CM

## 2020-09-05 DIAGNOSIS — Z3A33 33 weeks gestation of pregnancy: Secondary | ICD-10-CM

## 2020-09-05 DIAGNOSIS — N898 Other specified noninflammatory disorders of vagina: Secondary | ICD-10-CM | POA: Insufficient documentation

## 2020-09-05 MED ORDER — TERCONAZOLE 0.4 % VA CREA: 1.0000 | TOPICAL_CREAM | Freq: Every day | VAGINAL | 0 refills | Status: DC

## 2020-09-05 NOTE — Progress Notes (Signed)
Pt c/o vaginal itching- did self swab

## 2020-09-05 NOTE — Progress Notes (Signed)
   PRENATAL VISIT NOTE  Subjective:  Mary [name] is a 24 y.o. [phone] at [redacted]w[redacted]d being seen today for ongoing prenatal care.  She is currently monitored for the following issues for this low-risk pregnancy and has Adolescent depression; Anxiety disorder of adolescence; Active labor at term; Supervision of other normal pregnancy, antepartum; Herpes simplex type 2 (HSV-2) infection affecting pregnancy, antepartum; Late prenatal care affecting pregnancy, antepartum; Obesity during pregnancy, antepartum; Trichomonal vaginitis during pregnancy in second trimester; Unintentional Tylenol overdose; and Anemia in pregnancy on their problem list.  Patient reports vaginal itching.  Contractions: Irritability. Vag. Bleeding: None.  Movement: Present. Denies leaking of fluid.   The following portions of the patient's history were reviewed and updated as appropriate: allergies, current medications, past family history, past medical history, past social history, past surgical history and problem list.   Objective:   Vitals:   09/05/20 1016  BP: 126/84  Pulse: 94  Weight: 228 lb (103.4 kg)    Fetal Status: Fetal Heart Rate (bpm): 134   Movement: Present     General:  Alert, oriented and cooperative. Patient is in no acute distress.  Skin: Skin is warm and dry. No rash noted.   Cardiovascular: Normal heart rate noted  Respiratory: Normal respiratory effort, no problems with respiration noted  Abdomen: Soft, gravid, appropriate for gestational age.  Pain/Pressure: Present     Pelvic: Cervical exam deferred        Extremities: Normal range of motion.  Edema: Trace  Mental Status: Normal mood and affect. Normal behavior. Normal judgment and thought content.   Assessment and Plan:  Pregnancy: [id number] at [redacted]w[redacted]d 1. Vaginal itching  - Cervicovaginal ancillary only( [place]) - RX Terazol 7 days - Has RX for valtrex, start at 36 weeks. Do not discuss in front of partner. - Doing well - BP normal    Preterm labor symptoms and general obstetric precautions including but not limited to vaginal bleeding, contractions, leaking of fluid and fetal movement were reviewed in detail with the patient. Please refer to After Visit Summary for other counseling recommendations.   No follow-ups on file.  Future Appointments  Date Time Provider Department Center  09/10/2020 10:15 AM [facility] HEALTH CLINICIAN [facility]  09/19/2020 11:15 AM [facility] NURSE [facility]  09/19/2020 11:30 AM [facility] US3 [facility]  09/25/2020 10:30 AM [place], Wilmer Floor, CNM [facility]    Venia Carbon, NP

## 2020-09-06 ENCOUNTER — Encounter: Payer: Medicaid Other | Admitting: Obstetrics & Gynecology

## 2020-09-06 LAB — CERVICOVAGINAL ANCILLARY ONLY
Bacterial Vaginitis (gardnerella): NEGATIVE
Candida Glabrata: NEGATIVE
Candida Vaginitis: POSITIVE — AB
Chlamydia: NEGATIVE
Comment: NEGATIVE
Comment: NEGATIVE
Comment: NEGATIVE
Comment: NEGATIVE
Comment: NEGATIVE
Comment: NORMAL
Neisseria Gonorrhea: NEGATIVE
Trichomonas: NEGATIVE

## 2020-09-08 NOTE — Progress Notes (Signed)
+   Yeast on wet prep Rx was sent on the day of her visit for Terazol 7   Joanthony Hamza, Harolyn Rutherford, NP 09/08/2020 12:43 PM

## 2020-09-10 ENCOUNTER — Ambulatory Visit (INDEPENDENT_AMBULATORY_CARE_PROVIDER_SITE_OTHER): Payer: Medicaid Other | Admitting: Clinical

## 2020-09-10 DIAGNOSIS — Z658 Other specified problems related to psychosocial circumstances: Secondary | ICD-10-CM

## 2020-09-10 DIAGNOSIS — F419 Anxiety disorder, unspecified: Secondary | ICD-10-CM | POA: Diagnosis not present

## 2020-09-10 NOTE — Patient Instructions (Signed)
Center for [facility] at [facility] [place], [place] [address] [phone] (main office) [phone] Nurse, mental health office)   24/7 [place] by [place]:  [place] (24/7): [phone]   [address] [place], [place] [address] - 24 Hour Crisis Line: [phone]  [facility]: - [facility] (24/7): [phone]   [address] [place], [place] [address] - [place]: [phone]      BRAINSTORMING  Develop a Plan Goals: . Provide a way to start conversation about your new life with a baby . Assist parents in recognizing and using resources within their reach . Help pave the way before birth for an easier period of transition afterwards.  Make a list of the following information to keep in a central location: . Full name of Mom and Partner: _____________________________________________ . 75 full name and Date of Birth: ___________________________________________ . Home Address: ___________________________________________________________ ________________________________________________________________________ . Home Phone: ____________________________________________________________ . Parents' cell numbers: _____________________________________________________ ________________________________________________________________________ . Name and contact info for OB: ______________________________________________ . Name and contact info for Pediatrician:________________________________________ . Contact info for Lactation Consultants: ________________________________________  REST and SLEEP *You each need at least 4-5 hours of uninterrupted sleep every day. Write specific names and contact information.* . How are you going to rest in the postpartum period? While partner's home? When partner returns to work?  When you both return to work? Marland Kitchen Where will your baby sleep? Marland Kitchen Who is available to help during the day? Evening? Night? . Who could move in for a period to help support you? Marland Kitchen What are some ideas to help you get enough sleep? __________________________________________________________________________________________________________________________________________________________________________________________________________________________________________ NUTRITIOUS FOOD AND DRINK *Plan for meals before your baby is born so you can have healthy food to eat during the immediate postpartum period.* . Who will look after breakfast? Lunch? Dinner? List names and contact information. Brainstorm quick, healthy ideas for each meal. . What can you do before baby is born to prepare meals for the postpartum period? . How can others help you with meals? Marland Kitchen Which grocery stores provide online shopping and delivery? Marland Kitchen Which restaurants offer take-out or delivery options? ______________________________________________________________________________________________________________________________________________________________________________________________________________________________________________________________________________________________________________________________________________________________________________________________________  CARE FOR MOM *It's important that mom is cared for and pampered in the postpartum period. Remember, the most important ways new mothers need care are: sleep, nutrition, gentle exercise, and time off.* . Who can come take care of mom during this period? Make a list of people with their contact information. . List some activities that make you feel cared for, rested, and energized? Who can make sure you have opportunities to do these things? . Does mom have a space of her very own within your home that's just for her? Make a "[facility]" where she can be  comfortable, rest, and renew herself daily. ______________________________________________________________________________________________________________________________________________________________________________________________________________________________________________________________________________________________________________________________________________________________________________________________________    CARE FOR AND FEEDING BABY *Knowledgeable and encouraging people will offer the best support with regard to feeding your baby.* . Educate yourself and choose the best feeding option for your baby. . Make a list of people who will guide, support, and be a resource for you as your care for and feed your baby. (Friends that have breastfed or are currently breastfeeding, lactation consultants, breastfeeding support groups, etc.) . Consider a postpartum doula. (These websites can give you information: dona.org & BuyingShow.es) . Seek out local breastfeeding resources like the breastfeeding support group at [employer] or [employer]. ______________________________________________________________________________________________________________________________________________________________________________________________________________________________________________________________________________________________________________________________________________________________________________________________________  CHORES AND ERRANDS . Who can help with a thorough cleaning before baby is born? . Make a list of people who will help with housekeeping and chores, like laundry, light cleaning, dishes, bathrooms, etc. . Who can run some errands for you? Marland Kitchen What can you do to make sure you are stocked with basic supplies before baby is born? . Who is going to do the  shopping? ______________________________________________________________________________________________________________________________________________________________________________________________________________________________________________________________________________________________________________________________________________________________________________________________________     Family Adjustment *Nurture yourselves.it helps parents be more loving and allows for better bonding with their child.* . What sorts of things do you and partner enjoy doing together? Which activities help you to connect and strengthen your relationship? Make a list of those things. Make a list of people whom you trust to care for your baby so you can have some time together as a couple. . What types of things help partner feel connected to Mom? Make a list. . What needs will partner have in order to bond with baby? . Other children? Who will care for them when you go into labor and while you are in the hospital? . Think about what the needs of your older children might be. Who can help you meet those needs? In what ways are you helping them prepare for bringing baby home? List some specific strategies you have for family adjustment. _______________________________________________________________________________________________________________________________________________________________________________________________________________________________________________________________________________________________________________________________________________  SUPPORT *Someone who can empathize with experiences normalizes your problems and makes them more bearable.* . Make a list of other friends, neighbors, and/or co-workers you know with infants (and small children, if applicable) with whom you can connect. . Make a list of local or online support groups, mom groups, etc. in which you can be  involved. ______________________________________________________________________________________________________________________________________________________________________________________________________________________________________________________________________________________________________________________________________________________________________________________________________  Childcare Plans . Investigate and plan for childcare if mom is returning to work. . Talk about mom's concerns about her transition back to work. . Talk about partner's concerns regarding this transition.  Mental Health *Your mental health is one of the highest priorities for a pregnant or postpartum mom.* . 1 in 5 women experience anxiety and/or depression from the time of conception through the first year after birth. . Postpartum Mood Disorders are the #1 complication of pregnancy and childbirth and the suffering experienced by these mothers is not necessary! These illnesses are temporary and respond well to treatment, which often includes self-care, social support, talk therapy, and medication when needed. . Women experiencing anxiety and depression often say things like: "I'm supposed to be happy.why do I feel so sad?", "Why can't I snap out of it?", "I'm having thoughts that scare me." . There is no need to be embarrassed if you are feeling these symptoms: o Overwhelmed, anxious, angry, sad, guilty, irritable, hopeless, exhausted but can't sleep o You are NOT alone. You are NOT to blame. With help, you WILL be well. . Where can I find help? Medical professionals such as your OB, midwife, gynecologist, family practitioner, primary care provider, pediatrician, or mental health providers; [facility] support groups: Feelings After Birth, Breastfeeding Support Group, Baby and Me Group, and Fit 4 Two exercise classes. . You have permission to ask for help. It will confirm your feelings, validate your  experiences, share/learn coping strategies, and gain support and encouragement as you heal. You are important! BRAINSTORM . Make a list of local resources, including resources for mom and for partner. . Identify support groups. . Identify people to call late at night - include names and contact info. . Talk with partner about perinatal mood and anxiety disorders. Marland Kitchen  Talk with your OB, midwife, and doula about baby blues and about perinatal mood and anxiety disorders. . Talk with your pediatrician about perinatal mood and anxiety disorders.   Support & Sanity Savers   What do you really need?  . Basics . In preparing for a new baby, many expectant parents spend hours shopping for baby clothes, decorating the nursery, and deciding which car seat to buy. Yet most don't think much about what the reality of parenting a newborn will be like, and what they need to make it through that. So, here is the advice of experienced parents. We know you'll read this, and think "they're exaggerating, I don't really need that." Just trust [place] on these, OK? Plan for all of this, and if it turns out you don't need it, come back and teach [place] how you did it!  Satira Anis (Once baby's survival needs are met, make sure you attend to your own survival needs!) . Sleep . An average newborn sleeps 16-18 hours per day, over 6-7 sleep periods, rarely more than three hours at a time. It is normal and healthy for a newborn to wake throughout the night... but really hard on parents!! . Naps. Prioritize sleep above any responsibilities like: cleaning house, visiting friends, running errands, etc.  Sleep whenever baby sleeps. If you can't nap, at least have restful times when baby eats. The more rest you get, the more patient you will be, the more emotionally stable, and better at solving problems.  . Food . You may not have realized it would be difficult to eat when you have a newborn. Yet, when we talk to . countless new  parents, they say things like "it may be 2:00 pm when I realize I haven't had breakfast yet." Or "every time we sit down to dinner, baby needs to eat, and my food gets cold, so I don't bother to eat it." . Finger food. Before your baby is born, stock up with one months' worth of food that: 1) you can eat with one hand while holding a baby, 2) doesn't need to be prepped, 3) is good hot or cold, 4) doesn't spoil when left out for a few hours, and 5) you like to eat. Think about: nuts, dried fruit, Clif bars, pretzels, jerky, gogurt, baby carrots, apples, bananas, crackers, cheez-n-crackers, string cheese, hot pockets or frozen burritos to microwave, garden burgers and breakfast pastries to put in the toaster, yogurt drinks, etc. . Restaurant Menus. Make lists of your favorite restaurants & menu items. When family/friends want to help, you can give specific information without much thought. They can either bring you the food or send gift cards for just the right meals. Rosaura Carpenter Meals.  Take some time to make a few meals to put in the freezer ahead of time.  Easy to freeze meals can be anything such as soup, lasagna, chicken pie, or spaghetti sauce. . Set up a Meal Schedule.  Ask friends and family to sign up to bring you meals during the first few weeks of being home. (It can be passed around at baby showers!) You have no idea how helpful this will be until you are in the throes of parenting.  https://hamilton-woodard.com/ is a great website to check out. . Emotional Support . Know who to call when you're stressed out. Parenting a newborn is very challenging work. There are times when it totally overwhelms your normal coping abilities. EVERY NEW PARENT NEEDS TO HAVE A PLAN FOR WHO  TO CALL WHEN THEY JUST CAN'T COPE ANY MORE. (And it has to be someone other than the baby's other parent!) Before your baby is born, come up with at least one person you can call for support - write their phone number down and post it on the  refrigerator. Marland Kitchen Anxiety & Sadness. Baby blues are normal after pregnancy; however, there are more severe types of anxiety & sadness which can occur and should not be ignored.  They are always treatable, but you have to take the first step by reaching out for help. [facility] offers a "Mom Talk" group which meets every Tuesday from 10 am - 11 am.  This group is for new moms who need support and connection after their babies are born.  Call [phone].  Marland Kitchen Really, Really Helpful (Plan for them! Make sure these happen often!!) . Physical Support with Taking Care of Yourselves . Asking friends and family. Before your baby is born, set up a schedule of people who can come and visit and help out (or ask a friend to schedule for you). Any time someone says "let me know what I can do to help," sign them up for a day. When they get there, their job is not to take care of the baby (that's your job and your joy). Their job is to take care of you!  . Postpartum doulas. If you don't have anyone you can call on for support, look into postpartum doulas:  professionals at helping parents with caring for baby, caring for themselves, getting breastfeeding started, and helping with household tasks. www.padanc.org is a helpful website for learning about doulas in our area. . Peer Support / Parent Groups . Why: One of the greatest ideas for new parents is to be around other new parents. Parent groups give you a chance to share and listen to others who are going through the same season of life, get a sense of what is normal infant development by watching several babies learn and grow, share your stories of triumph and struggles with empathetic ears, and forgive your own mistakes when you realize all parents are learning by trial and error. . Where to find: There are many places you can meet other new parents throughout our community.  [facility] offers the following classes for new moms and their little ones:  Baby  and Me (Birth to [place]) and Breastfeeding Support Group. Go to www.conehealthybaby.com or call [phone] for more information. . Time for your Relationship . It's easy to get so caught up in meeting baby's immediate needs that it's hard to find time to connect with your partner, and meet the needs of your relationship. It's also easy to forget what "quality time with your partner" actually looks like. If you take your baby on a date, you'd be amazed how much of your couple time is spent feeding the baby, diapering the baby, admiring the baby, and talking about the baby. . Dating: Try to take time for just the two of you. Babysitter tip: Sometimes when moms are breastfeeding a newborn, they find it hard to figure out how to schedule outings around baby's unpredictable feeding schedules. Have the babysitter come for a three hour period. When she comes over, if baby has just eaten, you can leave right away, and come back in two hours. If baby hasn't fed recently, you start the date at home. Once baby gets hungry and gets a good feeding in, you can head out for the rest  of your date time. . Date Nights at Home: If you can't get out, at least set aside one evening a week to prioritize your relationship: whenever baby dozes off or doesn't have any immediate needs, spend a little time focusing on each other. . Potential conflicts: The main relationship conflicts that come up for new parents are: issues related to sexuality, financial stresses, a feeling of an unfair division of household tasks, and conflicts in parenting styles. The more you can work on these issues before baby arrives, the better!  Clint Guy and Frills (Don't forget these. and don't feel guilty for indulging in them!) . Everyone has something in life that is a fun little treat that they do just for themselves. It may be: reading the morning paper, or going for a daily jog, or having coffee with a friend once a week, or going to a movie on Friday  nights, or fine chocolates, or bubble baths, or curling up with a good book. . Unless you do fun things for yourself every now and then, it's hard to have the energy for fun with your baby. Whatever your "special" treats are, make sure you find a way to continue to indulge in them after your baby is born. These special moments can recharge you, and allow you to return to baby with a new joy   PERINATAL MOOD DISORDERS: [place]   Emergency and Crisis Resources:  If you are an imminent risk to self or others, are experiencing intense personal distress, and/or have noticed significant changes in activities of daily living, call:  . 911 . [facility]: [phone] . Mobile Crisis: [phone] . National Suicide Hotline: 304-134-7081 Or visit the following crisis centers: . Local Emergency Departments . [facility]: [address] [place]. Hours: 8:30AM-5PM. Insurance Accepted: Medicaid, Medicare, and Uninsured.  Marland Kitchen [facility]  [address] [place] Mon-Friday 8am-3pm  [phone]                                                                                    Non-Crisis Resources: To identify specific providers that are covered by your insurance, contact your insurance company or local agencies: [place]: [phone] [facility]: [phone] [place]: [phone] Postpartum Support International- [place] 1-843-145-9278                                                      Outpatient therapy and medication management providers:  [facility] [phone] Hours: 9AM-5PM  Insurance Accepted: Alben Spittle, Lorella Nimrod, Freddrick March, [place], Medicare  [facility] ([place]) [phone] Hours: 8AM-5PM  nsurance Accepted: All insurances EXCEPT AARP, [place], [place], and  [place]: [phone]             Hours: 8AM-8PM Insurance Accepted: Cristal Ford, Freddrick March, [place], Medicare, [phone] [facility]: [phone] Hours: 8:30AM-7PM Insurance Accepted: Cristal Ford, Medicaid, Medicare, [place],  [employer] Counseling:  [place] Accepted:  Holland Falling, [place], [facility], [place], [facility] [phone] Hours: 9AM-5:30PM Insurance Accepted: Alben Spittle, Charlotte Crumb, and [place], Medicare, [facility] [place] [facility]:  [phone] Hours: 9am-5pm Insurance Accepted: BCBS; they do not accept Medicaid/Medicare [place]: [phone] Hours: 9am-9pm Insurance Accepted: All major insurance including Medicaid and Medicare [facility]: [phone] Hours: 9AM-5:30PM Insurance Accepted: All insurances EXCEPT Medicaid and Medicare. [facility]: [place]: [phone] [place]:  [place] (support for children in the NICU and/or with special needs), [place] [employer]: [phone]                                                                                     Online Resources: Postpartum Support International: http://jones-berg.com/  800-944-4PPD 2Moms Supporting Moms:  www.momssupportingmoms.net   /Emotional [employer] and Websites Here are a few free apps meant to help you to help yourself.  To find, try searching on the internet to see if the app is offered on Apple/Android devices. If your first choice doesn't come up  on your device, the good news is that there are many choices! Play around with different apps to see which ones are helpful to you.    Calm This is an app meant to help increase calm feelings. Includes info, strategies, and tools for tracking your feelings.      Calm Harm  This app is meant to help with self-harm. Provides many 5-minute or 15-min coping strategies for doing instead of hurting yourself.       [place] is a problem-solving tool to help deal with emotions and cope with stress you encounter wherever you are.      MindShift This app can help people cope with anxiety. Rather than trying to avoid anxiety, you can make an important shift and face it.      MY3  MY3 features a support system, safety plan and resources with the goal of offering a tool to use in a time of need.       My Life My Voice  This mood journal offers a simple  solution for tracking your thoughts, feelings and moods. Animated emoticons can help identify your mood.       Relax Melodies Designed to help with sleep, on this app you can mix sounds and meditations for relaxation.      Smiling Mind Smiling Mind is meditation made easy: it's a simple tool that helps put a smile on your mind.        Stop, Breathe & Think  A friendly, simple guide for people through meditations for mindfulness and compassion.  Stop, Breathe and Think Kids Enter your current feelings and choose a "mission" to help you cope. Offers videos for certain moods instead of just sound recordings.       Team Orange The goal of this tool is to help teens change how they think, act, and react. This app helps you focus on your own good feelings and experiences.      The [place] Box The [place] Box (VHB) contains simple tools to help patients with coping, relaxation, distraction, and positive thinking.

## 2020-09-19 ENCOUNTER — Encounter: Payer: Self-pay | Admitting: *Deleted

## 2020-09-19 ENCOUNTER — Ambulatory Visit: Payer: Medicaid Other | Attending: Obstetrics and Gynecology

## 2020-09-19 ENCOUNTER — Other Ambulatory Visit: Payer: Self-pay

## 2020-09-19 ENCOUNTER — Ambulatory Visit: Payer: Medicaid Other | Admitting: *Deleted

## 2020-09-19 DIAGNOSIS — O0933 Supervision of pregnancy with insufficient antenatal care, third trimester: Secondary | ICD-10-CM

## 2020-09-19 DIAGNOSIS — A5901 Trichomonal vulvovaginitis: Secondary | ICD-10-CM | POA: Insufficient documentation

## 2020-09-19 DIAGNOSIS — O99213 Obesity complicating pregnancy, third trimester: Secondary | ICD-10-CM

## 2020-09-19 DIAGNOSIS — O23592 Infection of other part of genital tract in pregnancy, second trimester: Secondary | ICD-10-CM

## 2020-09-19 DIAGNOSIS — Z3A35 35 weeks gestation of pregnancy: Secondary | ICD-10-CM

## 2020-09-19 DIAGNOSIS — E669 Obesity, unspecified: Secondary | ICD-10-CM | POA: Diagnosis not present

## 2020-09-19 DIAGNOSIS — O288 Other abnormal findings on antenatal screening of mother: Secondary | ICD-10-CM | POA: Insufficient documentation

## 2020-09-19 DIAGNOSIS — O99210 Obesity complicating pregnancy, unspecified trimester: Secondary | ICD-10-CM

## 2020-09-19 DIAGNOSIS — Z363 Encounter for antenatal screening for malformations: Secondary | ICD-10-CM

## 2020-09-22 NOTE — L&D Delivery Note (Signed)
Delivery Note Called to room and patient complete and pushing. At 6:29 AM a viable female was delivered via Vaginal, Spontaneous (Presentation: Left Occiput Anterior). Loose nuchal cord x1, delivered through.  APGAR: 9, 9; weight 7 lb 9 oz (3430 g).   Placenta status: Spontaneous, Intact.  Cord: 3 vessels with the following complications: None.   Anesthesia: None Episiotomy: None Lacerations: None Est. Blood Loss (mL): 50  Mom to postpartum.  Baby to Couplet care / Skin to Skin.  Mary [name] 10/24/2020, 8:47 AM

## 2020-09-24 NOTE — BH Specialist Note (Signed)
Integrated Behavioral Health via Telemedicine Visit  09/24/2020 Mary [name] [id number]  Number of Integrated Behavioral Health visits: 3 Session Start time: 9:17  Session End time: 9:47 Total time: 30  Referring Provider: Venia Carbon, NP Patient/Family location: [facility] Provider location: [facility]  All persons participating in visit: [name] and Mary [name]   Types of Service: Individual psychotherapy  I connected with Mary [name] n/a by Video  (Video is Surveyor, mining) and verified that I am speaking with the correct person using two identifiers.Discussed confidentiality: Yes   I discussed the limitations of telemedicine and the availability of in person appointments.  Discussed there is a possibility of technology failure and discussed alternative modes of communication if that failure occurs.y  I discussed that engaging in this telemedicine visit, they consent to the provision of behavioral healthcare and the services will be billed under their insurance.  Patient and/or legal guardian expressed understanding and consented to Telemedicine visit: Yes   Presenting Concerns: Patient and/or family reports the following symptoms/concerns: Pt states her primary goals today are to make sure she is able to start antidepressant postpartum that is safe while breastfeeding; primary concern is managing life stress, including interpersonal relationship with mother, daycare closures, and housing.  Duration of problem: Increase in pregnancy; Severity of problem: moderate  Patient and/or Family's Strengths/Protective Factors: Concrete supports in place (healthy food, safe environments, etc.) and Sense of purpose  Goals Addressed: Patient will: 1.  Reduce symptoms of: anxiety, depression and stress  2.  Increase knowledge and/or ability of: stress reduction  3.  Demonstrate  ability to: Increase healthy adjustment to current life circumstances  Progress towards Goals: Ongoing  Interventions: Interventions utilized:  Solution-Focused Strategies Standardized Assessments completed: GAD-7 and PHQ 9  Patient and/or Family Response: Pt agrees to revised treatment plan  Assessment: Patient currently experiencing Adjustment disorder with depressed mood, Anxiety disorder, unspecified and Psychosocial stress.   Patient may benefit from continued psychoeducation and brief therapeutic interventions regarding coping with symptoms of anxiety, depression, life stress .  Plan: 1. Follow up with behavioral health clinician on : five weeks, or earlier as needed 2. Behavioral recommendations:  -Continue taking prenatal vitamin and iron daily, as prescribed -Talk to medical provider on 10/02/1020 about starting [facility] medication for depression and anxiety that is safe while breastfeeding -Continue self-advocacy regarding birthing plan surrounding visitors and waterbirth -Continue with plan to contact income-based housing group and WIC today; complete FAFSA paperwork to carry out plan to go back to school in fall 2022  3. Referral(s): Integrated [employer] (In Clinic)  I discussed the assessment and treatment plan with the patient and/or parent/guardian. They were provided an opportunity to ask questions and all were answered. They agreed with the plan and demonstrated an understanding of the instructions.   They were advised to call back or seek an in-person evaluation if the symptoms worsen or if the condition fails to improve as anticipated.  Rae Lips, LCSW   Depression screen [facility] 2/9 10/01/2020 08/27/2020  Decreased Interest 1 2  Down, Depressed, Hopeless 1 1  PHQ - 2 Score 2 3  Altered sleeping 3 3  Tired, decreased energy 3 3  Change in appetite 1 3  Feeling bad or failure about yourself  1 1  Trouble concentrating 1 1  Moving slowly or  fidgety/restless 2 1  Suicidal thoughts 0 0  PHQ-9 Score 13 15  GAD 7 : Generalized Anxiety Score 10/01/2020 08/27/2020  Nervous, Anxious, on Edge 1 3  Control/stop worrying 1 3  Worry too much - different things 1 3  Trouble relaxing 2 3  Restless 0 1  Easily annoyed or irritable 2 3  Afraid - awful might happen 1 1  Total GAD 7 Score 8 17

## 2020-09-25 ENCOUNTER — Other Ambulatory Visit: Payer: Self-pay

## 2020-09-25 ENCOUNTER — Ambulatory Visit (INDEPENDENT_AMBULATORY_CARE_PROVIDER_SITE_OTHER): Payer: Medicaid Other | Admitting: Advanced Practice Midwife

## 2020-09-25 ENCOUNTER — Other Ambulatory Visit (HOSPITAL_COMMUNITY)
Admission: RE | Admit: 2020-09-25 | Discharge: 2020-09-25 | Disposition: A | Discharge status: Home / Self Care | Payer: Medicaid Other | Source: Ambulatory Visit | Attending: Advanced Practice Midwife | Admitting: Advanced Practice Midwife

## 2020-09-25 VITALS — BP 113/82 | HR 94 | Wt 233.0 lb

## 2020-09-25 DIAGNOSIS — F419 Anxiety disorder, unspecified: Secondary | ICD-10-CM

## 2020-09-25 DIAGNOSIS — Z3A36 36 weeks gestation of pregnancy: Secondary | ICD-10-CM | POA: Diagnosis not present

## 2020-09-25 DIAGNOSIS — B009 Herpesviral infection, unspecified: Secondary | ICD-10-CM

## 2020-09-25 DIAGNOSIS — A5901 Trichomonal vulvovaginitis: Secondary | ICD-10-CM | POA: Diagnosis not present

## 2020-09-25 DIAGNOSIS — O23592 Infection of other part of genital tract in pregnancy, second trimester: Secondary | ICD-10-CM | POA: Diagnosis not present

## 2020-09-25 DIAGNOSIS — Z3480 Encounter for supervision of other normal pregnancy, unspecified trimester: Secondary | ICD-10-CM

## 2020-09-25 DIAGNOSIS — O98519 Other viral diseases complicating pregnancy, unspecified trimester: Secondary | ICD-10-CM

## 2020-09-25 DIAGNOSIS — Z3009 Encounter for other general counseling and advice on contraception: Secondary | ICD-10-CM

## 2020-09-25 LAB — OB RESULTS CONSOLE GBS: GBS: NEGATIVE

## 2020-09-25 NOTE — Progress Notes (Signed)
PRENATAL VISIT NOTE  Subjective:  Mary [name] is a 25 y.o. [phone] at [redacted]w[redacted]d being seen today for ongoing prenatal care.  She is currently monitored for the following issues for this low-risk pregnancy and has Adolescent depression; Anxiety disorder of adolescence; Active labor at term; Supervision of other normal pregnancy, antepartum; Herpes simplex type 2 (HSV-2) infection affecting pregnancy, antepartum; Late prenatal care affecting pregnancy, antepartum; Obesity during pregnancy, antepartum; Trichomonal vaginitis during pregnancy in second trimester; Unintentional Tylenol overdose; and Anemia in pregnancy on their problem list.  Patient reports occasional contractions.  Contractions: Not present. Vag. Bleeding: None.  Movement: Present. Denies leaking of fluid.   The following portions of the patient's history were reviewed and updated as appropriate: allergies, current medications, past family history, past medical history, past social history, past surgical history and problem list.   Objective:   Vitals:   09/25/20 1040  BP: 113/82  Pulse: 94  Weight: 233 lb (105.7 kg)    Fetal Status:     Movement: Present     General:  Alert, oriented and cooperative. Patient is in no acute distress.  Skin: Skin is warm and dry. No rash noted.   Cardiovascular: Normal heart rate noted  Respiratory: Normal respiratory effort, no problems with respiration noted  Abdomen: Soft, gravid, appropriate for gestational age.  Pain/Pressure: Absent     Pelvic: Cervical exam deferred        Extremities: Normal range of motion.  Edema: None  Mental Status: Normal mood and affect. Normal behavior. Normal judgment and thought content.   Assessment and Plan:  Pregnancy: G4P3003 at [redacted]w[redacted]d 1. [redacted] weeks gestation of pregnancy  - Culture, beta strep (group b only) - Cervicovaginal ancillary only( [place])  2. Supervision of other normal pregnancy, antepartum --Anticipatory guidance about next  visits/weeks of pregnancy given. --Interest in waterbirth, no barriers at today's visit, consent signed. Pt took class and I witnessed certificate on her email today, pt will bring printed certificate next week or send electronically via MyChart. --Pt with questions about cord blood donation, no public or hospital based banking currently, pt will need to pay private companies.  She can decide if cost effective for her at this time.  Will talk with her doula.  She plans placena encapsulation and doula is helping her with plan for this.  Pt will need to let them know on admission to L&D.   --Next appt in 1 week in office with midwife  3. Anxiety disorder, unspecified type --Seeing integrated BH, doing well at this time  4. Trichomonal vaginitis during pregnancy in second trimester --Tx 10/19, TOC negative --Added test today with 36 week swabs  5. Herpes simplex type 2 (HSV-2) infection affecting pregnancy, antepartum --Taking Valtrex 500 mg BID for prophylaxis, no recent outbreaks  6. Encounter for counseling regarding contraception --Pt interested in Paragard IUD, had in the past and she liked it but partner could feel it. She is not with this partner anymore. She was interested in the IUD in the hospital but would now like to wait until her office visit PP.  Discussed benefits and risks of both immediate PP placement and waiting until 4-6 week visit to place. Pt family would like her to have a BTL but she does not want to.  Discussed good effectiveness at preventing pregnancy with IUD.  Pt states understanding.  Term labor symptoms and general obstetric precautions including but not limited to vaginal bleeding, contractions, leaking of fluid and fetal movement were reviewed  in detail with the patient. Please refer to After Visit Summary for other counseling recommendations.   Return in about 1 week (around 10/02/2020).  Future Appointments  Date Time Provider Department Center  10/01/2020   9:15 AM [facility] HEALTH CLINICIAN [facility]  10/02/2020 10:30 AM Sharyon Cable, CNM [facility]    Sharen Counter, CNM

## 2020-09-25 NOTE — Patient Instructions (Signed)
Considering Waterbirth? Guide for [facility] at [facility]) Why consider waterbirth? . Gentle birth for babies  . Less pain medicine used in labor  . May allow for passive descent/less pushing  . May reduce perineal tears  . More mobility and instinctive maternal position changes  . Increased maternal relaxation   Is waterbirth safe? What are the risks of infection, drowning or other complications? . Infection:  . Very low risk (3.7 % for tub vs 4.8% for bed)  . 7 in 8000 waterbirths with documented infection  . Poorly cleaned equipment most common cause  . Slightly lower group B strep transmission rate  . Drowning  . Maternal:  . Very low risk  . Related to seizures or fainting  . Newborn:  . Very low risk. No evidence of increased risk of respiratory problems in multiple large studies  . Physiological protection from breathing under water  . Avoid underwater birth if there are any fetal complications  . Once baby's head is out of the water, keep it out.  . Birth complication  . Some reports of cord trauma, but risk decreased by bringing baby to surface gradually  . No evidence of increased risk of shoulder dystocia. Mothers can usually change positions faster in water than in a bed, possibly aiding the maneuvers to free the shoulder.   There are 2 things you MUST do to have a waterbirth with [facility]: 1. Attend a waterbirth class at [facility] [place]   a. 3rd Wednesday of every month from 7-9 pm (virtual during COVID) b. Free c. Register by calling [phone] or register online at [email or web address] d. Bring us the certificate from the class to your prenatal appointment or send via MyChart 2. Meet with a midwife at 36 weeks* to see if you can still plan a waterbirth and to sign the consent.   *We also recommend that you schedule as many of your prenatal visits with a midwife as possible.    Helpful information: . You may  want to bring a bathing suit top to the hospital to wear during labor but this is optional.  All other supplies are provided by the hospital. . Please arrive at the hospital with signs of active labor, and do not wait at home until late in labor. It takes 45 min- 2 hours for COVID testing, fetal monitoring, and check in to your room to take place, plus transport and filling of the waterbirth tub.    Things that would prevent you from having a waterbirth: . Unknown or Positive COVID-19 diagnosis upon admission to hospital* . Premature, <37wks  . Previous cesarean birth  . Presence of thick meconium-stained fluid  . Multiple gestation (Twins, triplets, etc.)  . Uncontrolled diabetes or gestational diabetes requiring medication  . Hypertension diagnosed in pregnancy or preexisting hypertension (gestational hypertension, preeclampsia, or chronic hypertension) . Heavy vaginal bleeding  . Non-reassuring fetal heart rate  . Active infection (MRSA, etc.). Group B Strep is NOT a contraindication for waterbirth.  . If your labor has to be induced and induction method requires continuous monitoring of the baby's heart rate  . Other risks/issues identified by your obstetrical provider   Please remember that birth is unpredictable. Under certain unforeseeable circumstances your provider may advise against giving birth in the tub. These decisions will be made on a case-by-case basis and with the safety of you and your baby as our highest priority.   *Please remember that in order   to have a waterbirth, you must test Negative to COVID-19 upon admission to the hospital.  Updated 08/07/2020  

## 2020-09-26 LAB — CERVICOVAGINAL ANCILLARY ONLY
Chlamydia: NEGATIVE
Comment: NEGATIVE
Comment: NEGATIVE
Comment: NORMAL
Neisseria Gonorrhea: NEGATIVE
Trichomonas: NEGATIVE

## 2020-09-28 LAB — CULTURE, BETA STREP (GROUP B ONLY)
MICRO NUMBER:: 11382834
SPECIMEN QUALITY:: ADEQUATE

## 2020-10-01 ENCOUNTER — Other Ambulatory Visit: Payer: Self-pay

## 2020-10-01 ENCOUNTER — Ambulatory Visit (INDEPENDENT_AMBULATORY_CARE_PROVIDER_SITE_OTHER): Payer: Medicaid Other | Admitting: Clinical

## 2020-10-01 DIAGNOSIS — F419 Anxiety disorder, unspecified: Secondary | ICD-10-CM | POA: Diagnosis not present

## 2020-10-01 DIAGNOSIS — F4321 Adjustment disorder with depressed mood: Secondary | ICD-10-CM | POA: Diagnosis not present

## 2020-10-01 DIAGNOSIS — Z658 Other specified problems related to psychosocial circumstances: Secondary | ICD-10-CM

## 2020-10-01 NOTE — Patient Instructions (Signed)
[facility] [address] [place], [place] [address] [phone] (main office) [phone] (Derrill Bagnell's office)   

## 2020-10-02 ENCOUNTER — Other Ambulatory Visit: Payer: Self-pay

## 2020-10-02 ENCOUNTER — Encounter: Payer: Self-pay | Admitting: Certified Nurse Midwife

## 2020-10-02 ENCOUNTER — Ambulatory Visit (INDEPENDENT_AMBULATORY_CARE_PROVIDER_SITE_OTHER): Payer: Medicaid Other | Admitting: Certified Nurse Midwife

## 2020-10-02 VITALS — BP 127/76 | HR 88 | Wt 233.0 lb

## 2020-10-02 DIAGNOSIS — B009 Herpesviral infection, unspecified: Secondary | ICD-10-CM

## 2020-10-02 DIAGNOSIS — O98519 Other viral diseases complicating pregnancy, unspecified trimester: Secondary | ICD-10-CM

## 2020-10-02 DIAGNOSIS — Z3480 Encounter for supervision of other normal pregnancy, unspecified trimester: Secondary | ICD-10-CM

## 2020-10-02 DIAGNOSIS — Z3A37 37 weeks gestation of pregnancy: Secondary | ICD-10-CM

## 2020-10-02 NOTE — Patient Instructions (Signed)
Reasons to go to MAU:  1.  Contractions are  5 minutes apart or less, each last 1 minute, these have been going on for 1-2 hours, and you cannot walk or talk during them 2.  You have a large gush of fluid, or a trickle of fluid that will not stop and you have to wear a pad 3.  You have bleeding that is bright red, heavier than spotting--like menstrual bleeding (spotting can be normal in early labor or after a check of your cervix) 4.  You do not feel the baby moving like he/she normally does  

## 2020-10-02 NOTE — Progress Notes (Signed)
   PRENATAL VISIT NOTE  Subjective:  Mary [name] is a 25 y.o. [phone] at [redacted]w[redacted]d being seen today for ongoing prenatal care.  She is currently monitored for the following issues for this low-risk pregnancy and has Adolescent depression; Anxiety disorder of adolescence; Active labor at term; Supervision of other normal pregnancy, antepartum; Herpes simplex type 2 (HSV-2) infection affecting pregnancy, antepartum; Late prenatal care affecting pregnancy, antepartum; Obesity during pregnancy, antepartum; Trichomonal vaginitis during pregnancy in second trimester; Unintentional Tylenol overdose; and Anemia in pregnancy on their problem list.  Patient reports no complaints.  Contractions: Irritability. Vag. Bleeding: None.  Movement: Present. Denies leaking of fluid.   The following portions of the patient's history were reviewed and updated as appropriate: allergies, current medications, past family history, past medical history, past social history, past surgical history and problem list.   Objective:   Vitals:   10/02/20 1011  BP: 127/76  Pulse: 88  Weight: 233 lb (105.7 kg)    Fetal Status: Fetal Heart Rate (bpm): 152 Fundal Height: 35 cm Movement: Present  Presentation: Vertex  General:  Alert, oriented and cooperative. Patient is in no acute distress.  Skin: Skin is warm and dry. No rash noted.   Cardiovascular: Normal heart rate noted  Respiratory: Normal respiratory effort, no problems with respiration noted  Abdomen: Soft, gravid, appropriate for gestational age.  Pain/Pressure: Present     Pelvic: Cervical exam deferred Dilation: Closed Effacement (%): Thick Station: Ballotable  Extremities: Normal range of motion.  Edema: None  Mental Status: Normal mood and affect. Normal behavior. Normal judgment and thought content.   Assessment and Plan:  Pregnancy: G4P3003 at [redacted]w[redacted]d 1. Supervision of other normal pregnancy, antepartum - patient doing well, no complaints or concerns at this  time  - Patient request cervical examination today  - routine prenatal care - anticipatory guidance on upcoming appointments  - educated and discussed with patient process of induction and usually occurring around 41 weeks unless otherwise indicated   2. Herpes simplex type 2 (HSV-2) infection affecting pregnancy, antepartum - continue Valtrex as prescribed   3. [redacted] weeks gestation of pregnancy  Term labor symptoms and general obstetric precautions including but not limited to vaginal bleeding, contractions, leaking of fluid and fetal movement were reviewed in detail with the patient. Please refer to After Visit Summary for other counseling recommendations.   Return in about 1 week (around 10/09/2020) for LROB, virtual.  Future Appointments  Date Time Provider Department Center  10/09/2020  3:10 PM Kendell Bane [facility]  11/06/2020  9:15 AM [facility]    Sharyon Cable, [place]

## 2020-10-09 ENCOUNTER — Telehealth (INDEPENDENT_AMBULATORY_CARE_PROVIDER_SITE_OTHER): Payer: Medicaid Other | Admitting: Advanced Practice Midwife

## 2020-10-09 ENCOUNTER — Other Ambulatory Visit: Payer: Self-pay

## 2020-10-09 ENCOUNTER — Inpatient Hospital Stay (HOSPITAL_COMMUNITY)
Admission: AD | Admit: 2020-10-09 | Discharge: 2020-10-09 | Discharge status: Against Medical Advice | Payer: Medicaid Other | Source: Home / Self Care | Attending: Obstetrics and Gynecology | Admitting: Obstetrics and Gynecology

## 2020-10-09 ENCOUNTER — Encounter (HOSPITAL_COMMUNITY): Payer: Self-pay | Admitting: Obstetrics and Gynecology

## 2020-10-09 DIAGNOSIS — O26893 Other specified pregnancy related conditions, third trimester: Secondary | ICD-10-CM | POA: Insufficient documentation

## 2020-10-09 DIAGNOSIS — U071 COVID-19: Secondary | ICD-10-CM

## 2020-10-09 DIAGNOSIS — O368190 Decreased fetal movements, unspecified trimester, not applicable or unspecified: Secondary | ICD-10-CM

## 2020-10-09 DIAGNOSIS — D649 Anemia, unspecified: Secondary | ICD-10-CM | POA: Diagnosis present

## 2020-10-09 DIAGNOSIS — A5901 Trichomonal vulvovaginitis: Secondary | ICD-10-CM

## 2020-10-09 DIAGNOSIS — Z5329 Procedure and treatment not carried out because of patient's decision for other reasons: Secondary | ICD-10-CM | POA: Diagnosis not present

## 2020-10-09 DIAGNOSIS — O368130 Decreased fetal movements, third trimester, not applicable or unspecified: Secondary | ICD-10-CM | POA: Diagnosis not present

## 2020-10-09 DIAGNOSIS — O98313 Other infections with a predominantly sexual mode of transmission complicating pregnancy, third trimester: Secondary | ICD-10-CM

## 2020-10-09 DIAGNOSIS — R030 Elevated blood-pressure reading, without diagnosis of hypertension: Secondary | ICD-10-CM

## 2020-10-09 DIAGNOSIS — O99210 Obesity complicating pregnancy, unspecified trimester: Secondary | ICD-10-CM

## 2020-10-09 DIAGNOSIS — Z3A38 38 weeks gestation of pregnancy: Secondary | ICD-10-CM | POA: Insufficient documentation

## 2020-10-09 DIAGNOSIS — O98513 Other viral diseases complicating pregnancy, third trimester: Secondary | ICD-10-CM | POA: Diagnosis not present

## 2020-10-09 DIAGNOSIS — A6000 Herpesviral infection of urogenital system, unspecified: Secondary | ICD-10-CM | POA: Diagnosis not present

## 2020-10-09 DIAGNOSIS — E669 Obesity, unspecified: Secondary | ICD-10-CM

## 2020-10-09 DIAGNOSIS — Z3480 Encounter for supervision of other normal pregnancy, unspecified trimester: Secondary | ICD-10-CM

## 2020-10-09 DIAGNOSIS — O99213 Obesity complicating pregnancy, third trimester: Secondary | ICD-10-CM

## 2020-10-09 DIAGNOSIS — O99013 Anemia complicating pregnancy, third trimester: Secondary | ICD-10-CM | POA: Diagnosis present

## 2020-10-09 DIAGNOSIS — O23592 Infection of other part of genital tract in pregnancy, second trimester: Secondary | ICD-10-CM

## 2020-10-09 LAB — URINALYSIS, ROUTINE W REFLEX MICROSCOPIC
Bilirubin Urine: NEGATIVE
Glucose, UA: NEGATIVE mg/dL
Hgb urine dipstick: NEGATIVE
Ketones, ur: NEGATIVE mg/dL
Leukocytes,Ua: NEGATIVE
Nitrite: NEGATIVE
Protein, ur: NEGATIVE mg/dL
Specific Gravity, Urine: 1.021 (ref 1.005–1.030)
pH: 5 (ref 5.0–8.0)

## 2020-10-09 NOTE — Progress Notes (Signed)
Fetal kick counts reported 1930-2000 9

## 2020-10-09 NOTE — Progress Notes (Signed)
Patient given kick count directions and button to assess fetal movement. From 1900-1930 patient reports 14 fetal kick counts, but says they are not the normal movements she's used to feeling

## 2020-10-09 NOTE — MAU Provider Note (Signed)
History     CSN: [id number]  Arrival date and time: 10/09/20 1830   None     Chief Complaint  Patient presents with  . Decreased Fetal Movement   HPI   Ms. Mary [name] is a 25 y.o. female [phone] @ [redacted]w[redacted]d here with decreased fetal movement. Her partner tested positive for Covid 1 week ago and she tested positive 4 days ago. The only symptoms she had was back pain and runny nose. She is fully vaccinated. She reports her baby is moving, but she doesn't feel the movements are how they normally feel; she reports movements to be less since testing positive for covid.  No bleeding, no leaking. She denies issues with her BP throughout the pregnancy.  Hx of HSV; on valtrex   OB History    Gravida  4   Para  3   Term  3   Preterm  0   AB  0   Living  3     SAB  0   IAB  0   Ectopic  0   Multiple  0   Live Births  3           Past Medical History:  Diagnosis Date  . Anxiety   . Asthma    as a child  . Depression   . History of chlamydia   . Trichomonas     Past Surgical History:  Procedure Laterality Date  . NO PAST SURGERIES      Family History  Problem Relation Age of Onset  . Cancer Maternal Grandmother   . Stroke Maternal Grandmother   . Depression Mother   . Bipolar disorder Mother   . Diabetes type II Mother   . Seizures Mother   . Hypertension Mother   . Obesity Mother   . Diabetes Mother   . Mental illness Mother   . Cancer Maternal Uncle     Social History   Tobacco Use  . Smoking status: Never Smoker  . Smokeless tobacco: Never Used  Vaping Use  . Vaping Use: Never used  Substance Use Topics  . Alcohol use: No  . Drug use: No    Allergies: No Known Allergies  No medications prior to admission.   Results for orders placed or performed during the hospital encounter of 10/09/20 (from the past 48 hour(s))  Urinalysis, Routine w reflex microscopic Urine, Clean Catch     Status: None   Collection Time: 10/09/20  7:30 PM   Result Value Ref Range   Color, Urine YELLOW YELLOW   APPearance CLEAR CLEAR   Specific Gravity, Urine 1.021 1.005 - 1.030   pH 5.0 5.0 - 8.0   Glucose, UA NEGATIVE NEGATIVE mg/dL   Hgb urine dipstick NEGATIVE NEGATIVE   Bilirubin Urine NEGATIVE NEGATIVE   Ketones, ur NEGATIVE NEGATIVE mg/dL   Protein, ur NEGATIVE NEGATIVE mg/dL   Nitrite NEGATIVE NEGATIVE   Leukocytes,Ua NEGATIVE NEGATIVE    Comment: Performed at [facility], [address], [place], [place] [address]   Review of Systems  Eyes: Negative for photophobia.  Neurological: Negative for headaches.   Physical Exam   Blood pressure 123/75, pulse 98, temperature 97.9 F (36.6 C), resp. rate 19, last menstrual period 01/14/2020, SpO2 98 %, unknown if currently breastfeeding.   Patient Vitals for the past 24 hrs:  BP Temp Pulse Resp SpO2  10/09/20 1946 123/75 - 98 - -  10/09/20 1938 (!) 133/91 - 88 - -  10/09/20  1922 130/71 - 85 - -  10/09/20 1901 131/90 - (!) 104 19 -  10/09/20 1850 138/81 97.9 F (36.6 C) 91 19 98 %   Physical Exam Constitutional:      General: She is not in acute distress.    Appearance: Normal appearance. She is not ill-appearing, toxic-appearing or diaphoretic.  HENT:     Head: Normocephalic.  Pulmonary:     Effort: Pulmonary effort is normal.     Breath sounds: Normal breath sounds.  Musculoskeletal:        General: Normal range of motion.  Skin:    General: Skin is warm.  Neurological:     Mental Status: She is alert and oriented to person, place, and time.  Psychiatric:        Behavior: Behavior normal.    Fetal Tracing: Baseline: 130 bpm Variability: Moderate  Accelerations: 15x15 Decelerations: None Toco: UI  MAU Course  Procedures  None  MDM  CBC & CMP ordered, patient declines blood work in MAU Covid + mom with decreased fetal movement and mildy elevated BP's. After discussing patient with Dr. Crissie Reese, it was recommended to the patient that she stay for  induction of labor. Patient spoke to her partner and they decided they wanted to go home to get a few things ready and then return.  Patient is aware of the risks of leaving AMA including fetal death. Overall her tracing is reactive, category 1 fetal tracing; however she continues to describe the movements as "decreased".   Assessment and Plan   1. COVID-19 affecting pregnancy in third trimester   2. [redacted] weeks gestation of pregnancy   3. Decreased fetal movement during pregnancy, antepartum, single or unspecified fetus   4. Elevated BP without diagnosis of hypertension     P:  Patient left AMA- she is planning to return for admission Discussed Patient with Dr. Jolayne Panther; she is aware the patient may return. Admission orders placed     Aniceto Kyser, Harolyn Rutherford, NP 10/09/2020 9:47 PM

## 2020-10-09 NOTE — MAU Note (Signed)
Patient advised by MAU provider Venia Carbon, NP on staying for admission. Patient given education on reasons to stay for admission. Patient re-educated on condition and reasons for staying by this nurse. Patient still deciding to leave against medical advise

## 2020-10-09 NOTE — MAU Note (Incomplete)
Patient reports decreased fetal movement since finding out she was Covid+.  Last movement felt was last night.  Patient states she has felt "a little uncomfortable" but no movements throughout the day today.  Denies any LOF/VB.

## 2020-10-09 NOTE — Progress Notes (Signed)
OBSTETRICS PRENATAL VIRTUAL VISIT ENCOUNTER NOTE  Provider location: Center for [facility] [place]   I connected with Mary [name] on 10/09/20 at  3:10 PM EST by MyChart Video Encounter at home and verified that I am speaking with the correct person using two identifiers.   I discussed the limitations, risks, security and privacy concerns of performing an evaluation and management service virtually and the availability of in person appointments. I also discussed with the patient that there may be a patient responsible charge related to this service. The patient expressed understanding and agreed to proceed. Subjective:  Mary [name] is a 25 y.o. [phone] at [redacted]w[redacted]d being seen today for ongoing prenatal care.  She is currently monitored for the following issues for this low-risk pregnancy and has Adolescent depression; Anxiety disorder of adolescence; Supervision of other normal pregnancy, antepartum; Herpes simplex type 2 (HSV-2) infection affecting pregnancy, antepartum; Late prenatal care affecting pregnancy, antepartum; Obesity during pregnancy, antepartum; Trichomonal vaginitis during pregnancy in second trimester; Unintentional Tylenol overdose; and Anemia in pregnancy on their problem list.  Patient reports congestion, cough, decreased fetal movement.  Contractions: Not present. Vag. Bleeding: None.  Movement: (!) Decreased. Denies any leaking of fluid.   The following portions of the patient's history were reviewed and updated as appropriate: allergies, current medications, past family history, past medical history, past social history, past surgical history and problem list.   Objective:  There were no vitals filed for this visit.  Fetal Status:     Movement: (!) Decreased     General:  Alert, oriented and cooperative. Patient is in no acute distress.  Respiratory: Normal respiratory effort, no problems with respiration noted  Mental Status: Normal mood and affect.  Normal behavior. Normal judgment and thought content.  Rest of physical exam deferred due to type of encounter  Imaging: [place] MFM OB FOLLOW UP  Result Date: 09/19/2020 ----------------------------------------------------------------------  OBSTETRICS REPORT                       (Signed Final 09/19/2020 12:06 pm) ---------------------------------------------------------------------- Patient Info  ID #:       [id number]                          D.O.B.:  1995/10/11 (24 yrs)  Name:       Mary [name]                  Visit Date: 09/19/2020 11:34 am ---------------------------------------------------------------------- Performed By  Attending:        Noralee Space MD        Ref. Address:     [address], [place]  Performed By:     Jenel Lucks     [facility]  Referred By:      Everardo All ---------------------------------------------------------------------- Orders  #  Description                           Code        Ordered By  1  [place] MFM OB FOLLOW UP                   [phone]    YU FANG ----------------------------------------------------------------------  #  Order #                     Accession #                Episode #  1  [id number]                   [id number]                 [id number] ---------------------------------------------------------------------- Indications  [redacted] weeks gestation of pregnancy                Z3A.35  Obesity complicating pregnancy, second         O99.212  trimester (BMI 34)  Late prenatal care, second trimester           O09.32  LR NIPS  Abnormal fetal ultrasound (at [facility])     O28.9  Thickened NF, Absent NB  Encounter for antenatal screening for          Z36.3   malformations ---------------------------------------------------------------------- Fetal Evaluation  Num Of Fetuses:         1  Fetal Heart Rate(bpm):  132  Cardiac Activity:       Observed  Presentation:           Cephalic  Placenta:               Anterior  P. Cord Insertion:      Previously Visualized  Amniotic Fluid  AFI FV:      Within normal limits  AFI Sum(cm)     %Tile       Largest Pocket(cm)  12.15           37          6.13  RUQ(cm)       RLQ(cm)       LUQ(cm)        LLQ(cm)  6.13          2.21          0              3.81 ---------------------------------------------------------------------- Biophysical Evaluation  Amniotic F.V:   Within normal limits       F. Tone:  Observed  F. Movement:    Observed                   Score:          8/8  F. Breathing:   Observed ---------------------------------------------------------------------- Biometry  BPD:      87.5  mm     G. Age:  35w 2d         49  %    CI:        77.69   %    70 - 86                                                          FL/HC:      21.5   %    20.1 - 22.1  HC:      314.2  mm     G. Age:  35w 2d         13  %    HC/AC:      1.01        0.93 - 1.11  AC:      311.4  mm     G. Age:  35w 1d         44  %    FL/BPD:     77.3   %    71 - 87  FL:       67.6  mm     G. Age:  34w 5d         23  %    FL/AC:      21.7   %    20 - 24  Est. FW:    2576  gm    5 lb 11 oz      34  % ---------------------------------------------------------------------- OB History  Blood Type:   O+  Gravidity:    4         Term:   3        Prem:   0        SAB:   0  TOP:          0       Ectopic:  0        Living: 3 ---------------------------------------------------------------------- Gestational Age  LMP:           35w 4d        Date:  01/14/20                 EDD:   10/20/20  U/S Today:     35w 1d                                        EDD:   10/23/20  Best:          35w 4d     Det. By:  LMP  (01/14/20)          EDD:   10/20/20  ---------------------------------------------------------------------- Anatomy  Cranium:               Appears normal         Aortic Arch:            Previously seen  Cavum:                 Previously seen        Ductal Arch:            Previously seen  Ventricles:            Previously seen        Diaphragm:              Appears normal  Choroid Plexus:        Previously seen        Stomach:                Appears normal, left                                                                        sided  Cerebellum:            Previously seen        Abdomen:                Appears normal  Posterior Fossa:       Previously seen        Abdominal Wall:         Not well visualized  Nuchal Fold:           Not applicable (>20    Cord Vessels:           Appears normal ([redacted]                         wks GA)                                        vessel cord)  Face:                  Profile appears        Kidneys:                Appear normal                         normal  Lips:                  Previously seen        Bladder:                Appears normal  Thoracic:              Previously seen        Spine:                  Not well visualized  Heart:                 Appears normal         Upper Extremities:      Previously seen                         (4CH, axis, and  situs)  RVOT:                  Previously seen        Lower Extremities:      Previously seen  LVOT:                  Previously seen ---------------------------------------------------------------------- Impression  Patient returns for fetal growth assessment.  Fetal [facility], nasal bone was  absent.  On cell free fetal DNA screening, the risks of fetal  aneuploidies including Down syndrome was not increased.  Amniotic fluid is normal and good fetal activity is seen .Fetal  growth is appropriate for gestational age .  We reassured the patient of the findings  ---------------------------------------------------------------------- Recommendations  Follow-up scans as clinically indicated. ----------------------------------------------------------------------                  Noralee Spaceavi Shankar, MD Electronically Signed Final Report   09/19/2020 12:06 pm ----------------------------------------------------------------------   Assessment and Plan:  Pregnancy: [id number] at 757w3d 1. Trichomonal vaginitis during pregnancy in second trimester --TOC  negative  2. Obesity during pregnancy, antepartum   3. Supervision of other normal pregnancy, antepartum --Pt denies regular cramping, LOF, or vaginal bleeding --Next visit scheduled in 1 week in office with midwife   4. COVID-19 affecting pregnancy in third trimester --Pt positive with mild symptoms 10/06/20 --COVID symptoms continue to be mild with congestion, cough, body aches  5. Decreased fetal movements in third trimester, single or unspecified fetus --Discussed with pt and less movement than usual x 3 days, with almost no movement today.   --Pt to go to MAU for evaluation at this time.  Pt states understanding and can drive to MAU right away.  Term labor symptoms and general obstetric precautions including but not limited to vaginal bleeding, contractions, leaking of fluid and fetal movement were reviewed in detail with the patient. I discussed the assessment and treatment plan with the patient. The patient was provided an opportunity to ask questions and all were answered. The patient agreed with the plan and demonstrated an understanding of the instructions. The patient was advised to call back or seek an in-person office evaluation/[facility] for any urgent or concerning symptoms. Please refer to After Visit Summary for other counseling recommendations.   I provided 10 minutes of face-to-face time during this encounter.  No follow-ups on file.  Future Appointments  Date  Time Provider Department Center  11/06/2020  9:15 [facility]    Sharen CounterLisa Leftwich-Kirby, CNM [facility], [facility]

## 2020-10-09 NOTE — Progress Notes (Signed)
Pt has been unable to take BP. Pt has covid. Pt c/o decreased fetal movement since Sunday

## 2020-10-10 ENCOUNTER — Other Ambulatory Visit: Payer: Self-pay

## 2020-10-10 ENCOUNTER — Observation Stay (HOSPITAL_COMMUNITY)
Admission: AD | Admit: 2020-10-10 | Discharge: 2020-10-10 | DRG: 831 | Discharge status: Against Medical Advice | Payer: Medicaid Other | Attending: Obstetrics and Gynecology | Admitting: Obstetrics and Gynecology

## 2020-10-10 ENCOUNTER — Encounter (HOSPITAL_COMMUNITY): Payer: Self-pay | Admitting: Obstetrics and Gynecology

## 2020-10-10 DIAGNOSIS — O368190 Decreased fetal movements, unspecified trimester, not applicable or unspecified: Secondary | ICD-10-CM | POA: Diagnosis present

## 2020-10-10 DIAGNOSIS — O0930 Supervision of pregnancy with insufficient antenatal care, unspecified trimester: Secondary | ICD-10-CM

## 2020-10-10 DIAGNOSIS — Z3480 Encounter for supervision of other normal pregnancy, unspecified trimester: Secondary | ICD-10-CM

## 2020-10-10 DIAGNOSIS — B009 Herpesviral infection, unspecified: Secondary | ICD-10-CM | POA: Diagnosis present

## 2020-10-10 DIAGNOSIS — A5901 Trichomonal vulvovaginitis: Secondary | ICD-10-CM | POA: Diagnosis present

## 2020-10-10 DIAGNOSIS — U071 COVID-19: Secondary | ICD-10-CM | POA: Diagnosis present

## 2020-10-10 DIAGNOSIS — O98513 Other viral diseases complicating pregnancy, third trimester: Secondary | ICD-10-CM | POA: Diagnosis present

## 2020-10-10 DIAGNOSIS — O99210 Obesity complicating pregnancy, unspecified trimester: Secondary | ICD-10-CM

## 2020-10-10 DIAGNOSIS — O98519 Other viral diseases complicating pregnancy, unspecified trimester: Secondary | ICD-10-CM | POA: Diagnosis present

## 2020-10-10 DIAGNOSIS — O99019 Anemia complicating pregnancy, unspecified trimester: Secondary | ICD-10-CM | POA: Diagnosis present

## 2020-10-10 DIAGNOSIS — Z5329 Procedure and treatment not carried out because of patient's decision for other reasons: Secondary | ICD-10-CM

## 2020-10-10 DIAGNOSIS — O23592 Infection of other part of genital tract in pregnancy, second trimester: Principal | ICD-10-CM

## 2020-10-10 HISTORY — DX: Gestational (pregnancy-induced) hypertension without significant proteinuria, unspecified trimester: O13.9

## 2020-10-10 LAB — CBC WITH DIFFERENTIAL/PLATELET
Abs Immature Granulocytes: 0.06 10*3/uL (ref 0.00–0.07)
Basophils Absolute: 0 10*3/uL (ref 0.0–0.1)
Basophils Relative: 0 %
Eosinophils Absolute: 0.1 10*3/uL (ref 0.0–0.5)
Eosinophils Relative: 1 %
HCT: 38.7 % (ref 36.0–46.0)
Hemoglobin: 12.3 g/dL (ref 12.0–15.0)
Immature Granulocytes: 1 %
Lymphocytes Relative: 19 %
Lymphs Abs: 1.6 10*3/uL (ref 0.7–4.0)
MCH: 27.6 pg (ref 26.0–34.0)
MCHC: 31.8 g/dL (ref 30.0–36.0)
MCV: 86.8 fL (ref 80.0–100.0)
Monocytes Absolute: 0.5 10*3/uL (ref 0.1–1.0)
Monocytes Relative: 5 %
Neutro Abs: 6.2 10*3/uL (ref 1.7–7.7)
Neutrophils Relative %: 74 %
Platelets: 162 10*3/uL (ref 150–400)
RBC: 4.46 MIL/uL (ref 3.87–5.11)
RDW: 16.3 % — ABNORMAL HIGH (ref 11.5–15.5)
WBC: 8.4 10*3/uL (ref 4.0–10.5)
nRBC: 0 % (ref 0.0–0.2)

## 2020-10-10 LAB — TYPE AND SCREEN
ABO/RH(D): O POS
Antibody Screen: NEGATIVE

## 2020-10-10 LAB — COMPREHENSIVE METABOLIC PANEL
ALT: 15 U/L (ref 0–44)
AST: 24 U/L (ref 15–41)
Albumin: 2.8 g/dL — ABNORMAL LOW (ref 3.5–5.0)
Alkaline Phosphatase: 172 U/L — ABNORMAL HIGH (ref 38–126)
Anion gap: 12 (ref 5–15)
BUN: 10 mg/dL (ref 6–20)
CO2: 17 mmol/L — ABNORMAL LOW (ref 22–32)
Calcium: 9 mg/dL (ref 8.9–10.3)
Chloride: 108 mmol/L (ref 98–111)
Creatinine, Ser: 0.61 mg/dL (ref 0.44–1.00)
GFR, Estimated: >60 mL/min (ref >60)
Glucose, Bld: 114 mg/dL — ABNORMAL HIGH (ref 70–99)
Potassium: 3.4 mmol/L — ABNORMAL LOW (ref 3.5–5.1)
Sodium: 137 mmol/L (ref 135–145)
Total Bilirubin: 0.8 mg/dL (ref 0.3–1.2)
Total Protein: 5.8 g/dL — ABNORMAL LOW (ref 6.5–8.1)

## 2020-10-10 LAB — PROTEIN / CREATININE RATIO, URINE
Creatinine, Urine: 135.72 mg/dL
Protein Creatinine Ratio: 0.1 mg/mg{Cre} (ref 0.00–0.15)
Total Protein, Urine: 14 mg/dL

## 2020-10-10 LAB — RPR: RPR Ser Ql: NONREACTIVE

## 2020-10-10 MED ORDER — LIDOCAINE HCL (PF) 1 % IJ SOLN: 30.0000 mL | INTRAMUSCULAR | Status: DC | PRN

## 2020-10-10 MED ORDER — OXYTOCIN-SODIUM CHLORIDE 30-0.9 UT/500ML-% IV SOLN: 2.5000 [IU]/h | INTRAVENOUS | Status: DC

## 2020-10-10 MED ORDER — OXYCODONE-ACETAMINOPHEN 5-325 MG PO TABS: 1.0000 | ORAL_TABLET | ORAL | Status: DC | PRN

## 2020-10-10 MED ORDER — TERBUTALINE SULFATE 1 MG/ML IJ SOLN: 0.2500 mg | Freq: Once | INTRAMUSCULAR | Status: DC | PRN

## 2020-10-10 MED ORDER — MISOPROSTOL 50MCG HALF TABLET
ORAL_TABLET | ORAL | Status: AC
  Filled 2020-10-10: qty 1

## 2020-10-10 MED ORDER — ONDANSETRON HCL 4 MG/2ML IJ SOLN: 4.0000 mg | Freq: Four times a day (QID) | INTRAMUSCULAR | Status: DC | PRN

## 2020-10-10 MED ORDER — SOD CITRATE-CITRIC ACID 500-334 MG/5ML PO SOLN: 30.0000 mL | ORAL | Status: DC | PRN

## 2020-10-10 MED ORDER — ACETAMINOPHEN 325 MG PO TABS: 650.0000 mg | ORAL_TABLET | ORAL | Status: DC | PRN

## 2020-10-10 MED ORDER — LOPERAMIDE HCL 2 MG PO CAPS
4.0000 mg | ORAL_CAPSULE | ORAL | Status: DC | PRN
  Administered 2020-10-10: 4 mg via ORAL
  Filled 2020-10-10: qty 2

## 2020-10-10 MED ORDER — OXYTOCIN BOLUS FROM INFUSION: 333.0000 mL | Freq: Once | INTRAVENOUS | Status: DC

## 2020-10-10 MED ORDER — LACTATED RINGERS IV SOLN: 500.0000 mL | INTRAVENOUS | Status: DC | PRN

## 2020-10-10 MED ORDER — MISOPROSTOL 50MCG HALF TABLET
50.0000 ug | ORAL_TABLET | ORAL | Status: DC | PRN
  Administered 2020-10-10 (×4): 50 ug via BUCCAL
  Filled 2020-10-10 (×3): qty 1

## 2020-10-10 MED ORDER — VALACYCLOVIR HCL 500 MG PO TABS
500.0000 mg | ORAL_TABLET | Freq: Every day | ORAL | Status: DC
  Administered 2020-10-10: 500 mg via ORAL
  Filled 2020-10-10: qty 1

## 2020-10-10 MED ORDER — OXYCODONE-ACETAMINOPHEN 5-325 MG PO TABS: 2.0000 | ORAL_TABLET | ORAL | Status: DC | PRN

## 2020-10-10 MED ORDER — LACTATED RINGERS IV SOLN: INTRAVENOUS | Status: DC

## 2020-10-10 NOTE — H&P (Addendum)
OBSTETRIC ADMISSION HISTORY AND PHYSICAL  Mary [name] is a 25 y.o. female [phone] with IUP at [redacted]w[redacted]d by LMP presenting for IOL-elevated BP and DFM. Patient presented to clinic 10/09/20 with DFM and was sent to MAU for further evaluation. She continued to have DFM with overall reassuring FHT, reactive NST. She additionally had a BP of 133/91 in MAU but was otherwise asymptomatic. She reports no LOF, no VB, no blurry vision, headaches or peripheral edema, and RUQ pain.  She plans on breast feeding. She request paragard outpatient for birth control. She received her prenatal care at [facility]   Dating: By LMP --->  Estimated Date of Delivery: 10/20/20  Sono:    09/19/20@[redacted]w[redacted]d , CWD, normal anatomy, cephalic presentation, 2576g, 90% EFW   Prenatal History/Complications:  HSV (on suppression) Late PNC Trichomonas in pregnancy (neg TOC) Anemia in pregnancy (admit hgb 12.3, on PO iron) COVID + (mild symptoms, diagnosed 10/06/20)  Past Medical History: Past Medical History:  Diagnosis Date  . Anxiety   . Asthma    as a child  . Depression   . History of chlamydia   . Pregnancy induced hypertension   . Trichomonas     Past Surgical History: Past Surgical History:  Procedure Laterality Date  . NO PAST SURGERIES      Obstetrical History: OB History    Gravida  4   Para  3   Term  3   Preterm  0   AB  0   Living  3     SAB  0   IAB  0   Ectopic  0   Multiple  0   Live Births  3           Social History Social History   Socioeconomic History  . Marital status: Single    Spouse name: Not on file  . Number of children: Not on file  . Years of education: Not on file  . Highest education level: Not on file  Occupational History  . Not on file  Tobacco Use  . Smoking status: Never Smoker  . Smokeless tobacco: Never Used  Vaping Use  . Vaping Use: Never used  Substance and Sexual Activity  . Alcohol use: No  . Drug use: No  . Sexual activity: Yes    Birth  control/protection: None  Other Topics Concern  . Not on file  Social History Narrative  . Not on file   Social Determinants of Health   Financial Resource Strain: Not on file  Food Insecurity: Not on file  Transportation Needs: Not on file  Physical Activity: Not on file  Stress: Not on file  Social Connections: Not on file    Family History: Family History  Problem Relation Age of Onset  . Cancer Maternal Grandmother   . Stroke Maternal Grandmother   . Depression Mother   . Bipolar disorder Mother   . Diabetes type II Mother   . Seizures Mother   . Hypertension Mother   . Obesity Mother   . Diabetes Mother   . Mental illness Mother   . Cancer Maternal Uncle     Allergies: No Known Allergies  Medications Prior to Admission  Medication Sig Dispense Refill Last Dose  . aspirin EC 81 MG tablet Take 1 tablet (81 mg total) by mouth daily. Take after 12 weeks for prevention of preeclampsia later in pregnancy 300 tablet 0 10/10/2020 at Unknown time  . Iron, Ferrous Sulfate, 325 (65 Fe) MG TABS Take  1 tablet by mouth daily with breakfast. 30 tablet 2 10/10/2020 at Unknown time  . Prenatal Vit-Fe Fumarate-FA (PRENATAL MULTIVITAMIN) TABS Take 1 tablet by mouth daily.     10/10/2020 at Unknown time  . valACYclovir (VALTREX) 500 MG tablet Take 1 tablet (500 mg total) by mouth 2 (two) times daily. Start at 36 weeks 90 tablet 1 10/10/2020 at Unknown time  . acetaminophen (TYLENOL) 325 MG tablet Take 2 tablets (650 mg total) by mouth every 4 (four) hours as needed (for pain scale < 4). 90 tablet 0   . senna-docusate (SENOKOT-S) 8.6-50 MG tablet Take 1 tablet by mouth at bedtime. (Patient not taking: No sig reported) 60 tablet 0      Review of Systems   All systems reviewed and negative except as stated in HPI  Blood pressure 118/71, pulse 82, temperature 97.6 F (36.4 C), temperature source Oral, resp. rate 16, last menstrual period 01/14/2020, unknown if currently  breastfeeding. General appearance: alert, cooperative and no distress Lungs: normal respiratory effort Heart: regular rate and rhythm Abdomen: soft, non-tender; gravid Pelvic: as noted below Extremities: Homans sign is negative, no sign of DVT Presentation: cephalic by bedside US Fetal monitoringBaseline: 130 bpm, Variability: Good {> 6 bpm), Accelerations: Reactive and Decelerations: Absent Uterine activityNone Dilation: 1 Effacement (%): Thick Exam by:: Dr. Leary Rocamahoney (Ultrasound used to confirm vertex)   Prenatal labs: ABO, Rh: --/--/O POS (01/19 [id number]) Antibody: NEG (01/19 0447) Rubella: 1.60 (10/19 1120) RPR: NON-REACTIVE (11/17 0000)  HBsAg: NON-REACTIVE (10/19 1120)  HIV: NON-REACTIVE (11/17 0000)  GBS: Negative/-- (01/04 0000)  2 hr Glucola passed Genetic screening  normal Anatomy US normal but limited given body habitus  Prenatal Transfer Tool  Maternal Diabetes: No Genetic Screening: Normal Maternal Ultrasounds/Referrals: Normal Fetal Ultrasounds or other Referrals:  None Maternal Substance Abuse:  No Significant Maternal Medications:  None Significant Maternal Lab Results: Group B Strep negative  Results for orders placed or performed during the hospital encounter of 10/10/20 (from the past 24 hour(s))  Protein / creatinine ratio, urine   Collection Time: 10/09/20  7:47 PM  Result Value Ref Range   Creatinine, Urine 135.72 mg/dL   Total Protein, Urine 14 mg/dL   Protein Creatinine Ratio 0.10 0.00 - 0.15 mg/mg[Cre]  Comprehensive metabolic panel   Collection Time: 10/10/20  4:47 AM  Result Value Ref Range   Sodium 137 135 - 145 mmol/L   Potassium 3.4 (L) 3.5 - 5.1 mmol/L   Chloride 108 98 - 111 mmol/L   CO2 17 (L) 22 - 32 mmol/L   Glucose, Bld 114 (H) 70 - 99 mg/dL   BUN 10 6 - 20 mg/dL   Creatinine, Ser 1.190.61 0.44 - 1.00 mg/dL   Calcium 9.0 8.9 - 14.710.3 mg/dL   Total Protein 5.8 (L) 6.5 - 8.1 g/dL   Albumin 2.8 (L) 3.5 - 5.0 g/dL   AST 24 15 - 41 U/L   ALT  15 0 - 44 U/L   Alkaline Phosphatase 172 (H) 38 - 126 U/L   Total Bilirubin 0.8 0.3 - 1.2 mg/dL   GFR, Estimated >82>60 >95>60 mL/min   Anion gap 12 5 - 15  Type and screen [facility]   Collection Time: 10/10/20  4:47 AM  Result Value Ref Range   ABO/RH(D) O POS    Antibody Screen NEG    Sample Expiration      10/13/2020,2359 Performed at [facility], [address] [place], [place] [address]  CBC with Differential/Platelet   Collection Time: 10/10/20  4:54 AM  Result Value Ref Range   WBC 8.4 4.0 - 10.5 K/uL   RBC 4.46 3.87 - 5.11 MIL/uL   Hemoglobin 12.3 12.0 - 15.0 g/dL   HCT 24.4 01.0 - 27.2 %   MCV 86.8 80.0 - 100.0 fL   MCH 27.6 26.0 - 34.0 pg   MCHC 31.8 30.0 - 36.0 g/dL   RDW 53.6 (H) 64.4 - 03.4 %   Platelets 162 150 - 400 K/uL   nRBC 0.0 0.0 - 0.2 %   Neutrophils Relative % 74 %   Neutro Abs 6.2 1.7 - 7.7 K/uL   Lymphocytes Relative 19 %   Lymphs Abs 1.6 0.7 - 4.0 K/uL   Monocytes Relative 5 %   Monocytes Absolute 0.5 0.1 - 1.0 K/uL   Eosinophils Relative 1 %   Eosinophils Absolute 0.1 0.0 - 0.5 K/uL   Basophils Relative 0 %   Basophils Absolute 0.0 0.0 - 0.1 K/uL   Immature Granulocytes 1 %   Abs Immature Granulocytes 0.06 0.00 - 0.07 K/uL  Results for orders placed or performed during the hospital encounter of 10/09/20 (from the past 24 hour(s))  Urinalysis, Routine w reflex microscopic Urine, Clean Catch   Collection Time: 10/09/20  7:30 PM  Result Value Ref Range   Color, Urine YELLOW YELLOW   APPearance CLEAR CLEAR   Specific Gravity, Urine 1.021 1.005 - 1.030   pH 5.0 5.0 - 8.0   Glucose, UA NEGATIVE NEGATIVE mg/dL   Hgb urine dipstick NEGATIVE NEGATIVE   Bilirubin Urine NEGATIVE NEGATIVE   Ketones, ur NEGATIVE NEGATIVE mg/dL   Protein, ur NEGATIVE NEGATIVE mg/dL   Nitrite NEGATIVE NEGATIVE   Leukocytes,Ua NEGATIVE NEGATIVE    Patient Active Problem List   Diagnosis Date Noted  . Decreased fetal movement 10/10/2020  .  COVID-19 affecting pregnancy in third trimester 10/09/2020  . Anemia in pregnancy 08/12/2020  . Unintentional Tylenol overdose 08/08/2020  . Trichomonal vaginitis during pregnancy in second trimester 07/12/2020  . Supervision of other normal pregnancy, antepartum 07/10/2020  . Herpes simplex type 2 (HSV-2) infection affecting pregnancy, antepartum 07/10/2020  . Late prenatal care affecting pregnancy, antepartum 07/10/2020  . Obesity during pregnancy, antepartum 07/10/2020  . Anxiety disorder of adolescence 11/05/2011    Assessment/Plan:  Mary [name] is a 25 y.o. G4P3003 at [redacted]w[redacted]d here for IOL-elevated BP, DFM.  #IOL: Patient previously sent from clinic to MAU 10/09/20 given DFM and was found to have reactive NST at that time. Given elevated DBP 90s recommendation to admit patient at that time. Patient left AMA and returned to MAU for admission this morning. Discussed IOL process with patient. Given cervical exam will start with cytotec. Will progress to FB and pitocin as able. #Pain: PRN #FWB: Cat I #ID: GBS neg #MOF: breast #MOC: paragard @pp  appt #Circ: n/a  #Elevated BP: BP on admit WNL. Does not meet criteria for gHTN. Asymptomatic. preE labs WNL on admission.  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  #Late PNC: patient transferred prenatal care from [facility] in [place] to [facility]. She did not start [facility] until October 2021.  #Anemia of pregnancy: hgb on admit WNL at 12.3. Previously on PO iron outpatient.   November 2021, MD  10/10/2020, 7:29 AM  GME ATTESTATION:  I saw and evaluated the patient. I agree with the findings and the plan of care as documented in the resident's note.  10/12/2020, MD OB  Fellow, Faculty Practice [facility], Center for [facility] 10/10/2020 7:36 AM
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
## 2020-10-10 NOTE — Progress Notes (Signed)
Labor Progress Note Rosalea Withrow is a 25 y.o. [phone] at [redacted]w[redacted]d presented for IOL for DFM and elevated BP without diagnosis.   S: Exhausted of IOL process. Desires to leave because things are taking so long. She is uncomfortable with multiple episodes of diarrhea today.  O:  BP 128/60   Pulse 80   Temp 97.7 F (36.5 C) (Oral)   Resp 16   LMP 01/14/2020  EFM: baseline 150/mod variability/+accels/no decels  Toco: q1-5 min   CVE: Dilation: Fingertip Effacement (%): Thick Cervical Position: Posterior Station: -2 Presentation: Vertex Exam by:: Davis,RN   A&P: 25 y.o. [id number] [redacted]w[redacted]d here for IOL, decreased FM and elevated BP without diagnosis.   #IOL: s/p cytotec x4. Patient extremely frustrated with IOL process, asking to leave AMA. Discussed extensively risk of DFM and elevated BP to patient and fetus, discussed risk of IUFD. Patient and partner will discuss their decision and notify RN.   #Covid Positive: mild symptoms. Airborne precautions. Experiencing diarrhea possibly 2/2 covid.  #Pain: pain meds, epidural prn  #FWB: Cat I  #GBS negative   #HSV: on valtrex, no lesions on external exam.  Alric Seton, MD 9:28 PM

## 2020-10-10 NOTE — Discharge Summary (Signed)
Discharge Summary    Patient Name: Jenniferann Stuckert DOB: [birth year] MRN: [id number]  Date of admission: 10/10/2020 Date of discharge: 10/10/2020  Admitting diagnosis: Decreased fetal movement [O36.8190] Intrauterine pregnancy: [redacted]w[redacted]d     Secondary diagnosis:  Active Problems:   Supervision of other normal pregnancy, antepartum   Herpes simplex type 2 (HSV-2) infection affecting pregnancy, antepartum   Late prenatal care affecting pregnancy, antepartum   Trichomonal vaginitis during pregnancy in second trimester   Anemia in pregnancy   COVID-19 affecting pregnancy in third trimester   Decreased fetal movement   Left against medical advice  Additional problems: none    Discharge diagnosis: DFM, elevated BP, IOL, left AMA                            Augmentation: Cytotecx4  Hospital course:   Patient initially presented to clinic 10/09/20 and was found to have elevated BP (mild range) at that time as well as reporting DFM. She was sent to MAU for further workup and evaluation where she endorsed continued DFM and DBP remained elevated, after further discussion decision was made to admit to L&D for IOL given GA. Prior to admission to L&D patient left MAU AMA. Patient then presented to MAU again overnight and decided she would like to initiate her IOL at that time. On the morning of 10/10/20 patient was admitted to IOL and process was initiated with cytotec. Patient received cytotec x4 and had softening of the cervix but did not make any cervical dilation change. She was offered a FB and refused. On the evening of 10/10/20 patient became frustrated with the IOL process and requested to leave AMA. The risks were discussed with patient including fetal and maternal harm, IUFD, eclampsia etc. Patient voiced understanding and stated she would like to leave. Given strict return precautions should she develop vision changes, headaches, cp, sob or DFM persists. FHT reassuring and maternal VSS upon discharge.  Patient with follow up at [facility] office.  Physical exam  Vitals:   10/10/20 1912 10/10/20 2034 10/10/20 2102 10/10/20 2320  BP: 117/73 128/60 125/70 132/75  Pulse: 82 80 84 78  Resp: 16  18 16   Temp: 97.7 F (36.5 C)   98.5 F (36.9 C)  TempSrc: Oral   Oral   General: alert, cooperative and no distress Lung: normal respiratory effort Cardiac: regular rate and rhythm Abdomen: gravid Extremities: no LE edema noted Psych: flat affect  Labs: Lab Results  Component Value Date   WBC 8.4 10/10/2020   HGB 12.3 10/10/2020   HCT 38.7 10/10/2020   MCV 86.8 10/10/2020   PLT 162 10/10/2020   CMP Latest Ref Rng & Units 10/10/2020  Glucose 70 - 99 mg/dL 10/12/2020)  BUN 6 - 20 mg/dL 10  Creatinine 119(J - 4.78 mg/dL 2.95  Sodium 6.21 - 308 mmol/L 137  Potassium 3.5 - 5.1 mmol/L 3.4(L)  Chloride 98 - 111 mmol/L 108  CO2 22 - 32 mmol/L 17(L)  Calcium 8.9 - 10.3 mg/dL 9.0  Total Protein 6.5 - 8.1 g/dL 657)  Total Bilirubin 0.3 - 1.2 mg/dL 0.8  Alkaline Phos 38 - 126 U/L 172(H)  AST 15 - 41 U/L 24  ALT 0 - 44 U/L 15   Edinburgh Score: No flowsheet data found.   After visit meds:  Allergies as of 10/10/2020   No Known Allergies     Medication List    TAKE these medications   acetaminophen 325 MG  tablet Commonly known as: Tylenol Take 2 tablets (650 mg total) by mouth every 4 (four) hours as needed (for pain scale < 4).   aspirin EC 81 MG tablet Take 1 tablet (81 mg total) by mouth daily. Take after 12 weeks for prevention of preeclampsia later in pregnancy   Iron (Ferrous Sulfate) 325 (65 Fe) MG Tabs Take 1 tablet by mouth daily with breakfast.   prenatal multivitamin Tabs tablet Take 1 tablet by mouth daily.   senna-docusate 8.6-50 MG tablet Commonly known as: Senokot-S Take 1 tablet by mouth at bedtime.   valACYclovir 500 MG tablet Commonly known as: Valtrex Take 1 tablet (500 mg total) by mouth 2 (two) times daily. Start at 36 weeks       Future  Appointments: Future Appointments  Date Time Provider Department Center  10/17/2020 10:50 AM Rasch, Harolyn Rutherford, NP [facility]  11/06/2020  9:15 AM [facility]    10/10/2020 Alric Seton, MD

## 2020-10-10 NOTE — Progress Notes (Signed)
Labor Progress Note Mary [name] is a 25 y.o. [phone] at [redacted]w[redacted]d presented for IOL for DFM.   S: Feeling tired. Having a lot of diarrhea.   O:  BP 125/72   Pulse 97   Temp (!) 97.1 F (36.2 C) (Axillary)   Resp 16   LMP 01/14/2020  EFM: baseline 135/mod variability/ pos accels/no decels    CVE: Dilation: Fingertip Effacement (%): Thick Cervical Position: Posterior Presentation: Vertex Exam by:: Dr. Myriam Jacobson   A&P: 25 y.o. [id number] [redacted]w[redacted]d here for IOL, decreased FM.   #Induction of Labor: s/p cytotec x2. Patient adamantly declines FB, will proceed w additional dose of cytotec and reevaluate.  #Covid Positive: mild symptoms. Airborne precautions. Experiencing diarrhea possibly 2/2 covid, will treat w imodium x1.   #Pain: pain meds, epidural prn  #FWB: Cat I  #GBS negative  Gita Kudo, MD 11:05 AM

## 2020-10-17 ENCOUNTER — Ambulatory Visit (INDEPENDENT_AMBULATORY_CARE_PROVIDER_SITE_OTHER): Payer: Medicaid Other | Admitting: Obstetrics and Gynecology

## 2020-10-17 ENCOUNTER — Other Ambulatory Visit: Payer: Self-pay | Admitting: Advanced Practice Midwife

## 2020-10-17 ENCOUNTER — Other Ambulatory Visit: Payer: Self-pay

## 2020-10-17 VITALS — BP 124/74 | HR 94 | Wt 235.0 lb

## 2020-10-17 DIAGNOSIS — Z3480 Encounter for supervision of other normal pregnancy, unspecified trimester: Secondary | ICD-10-CM

## 2020-10-17 NOTE — Progress Notes (Signed)
° °  PRENATAL VISIT NOTE  Subjective:  Mary [name] is a 25 y.o. [phone] at [redacted]w[redacted]d being seen today for ongoing prenatal care.  She is currently monitored for the following issues for this low-risk pregnancy and has Anxiety disorder of adolescence; Supervision of other normal pregnancy, antepartum; Herpes simplex type 2 (HSV-2) infection affecting pregnancy, antepartum; Late prenatal care affecting pregnancy, antepartum; Obesity during pregnancy, antepartum; Trichomonal vaginitis during pregnancy in second trimester; Unintentional Tylenol overdose; Anemia in pregnancy; COVID-19 affecting pregnancy in third trimester; Decreased fetal movement; and Left against medical advice on their problem list.  Patient reports no complaints.  Contractions: Not present. Vag. Bleeding: None.  Movement: Present. Denies leaking of fluid.   The following portions of the patient's history were reviewed and updated as appropriate: allergies, current medications, past family history, past medical history, past social history, past surgical history and problem list.   Objective:   Vitals:   10/17/20 1103  BP: 124/74  Pulse: 94  Weight: 235 lb (106.6 kg)    Fetal Status: Fetal Heart Rate (bpm): 134   Movement: Present  Presentation: Vertex  General:  Alert, oriented and cooperative. Patient is in no acute distress.  Skin: Skin is warm and dry. No rash noted.   Cardiovascular: Normal heart rate noted  Respiratory: Normal respiratory effort, no problems with respiration noted  Abdomen: Soft, gravid, appropriate for gestational age.  Pain/Pressure: Present     Pelvic: Cervical exam performed in the presence of a chaperone Dilation: 1 Effacement (%): 50 Station: Ballotable  Extremities: Normal range of motion.  Edema: None  Mental Status: Normal mood and affect. Normal behavior. Normal judgment and thought content.   Assessment and Plan:  Pregnancy: G4P3003 at [redacted]w[redacted]d  1. Supervision of other normal pregnancy,  antepartum  Was seen in MAU for decreased fetal movement after testing positive for Covid. She had a reactive fetal tracing, however was still not feeling baby move. Admission for delivery was advised and patient left AMA. She returned to L&D later that night for admission. She was given cytotec and refused foley bulb. She left AMA again.  - desires water birth, out of isolation for Covid.  - Discussed induction @ 40 weeks with foley bulb in-office. She is hesitant but agreeable if she does not go into labor on her own.    Term labor symptoms and general obstetric precautions including but not limited to vaginal bleeding, contractions, leaking of fluid and fetal movement were reviewed in detail with the patient. Please refer to After Visit Summary for other counseling recommendations.   Return in about 6 days (around 10/23/2020), or For foley insertion.  Future Appointments  Date Time Provider Department Center  10/23/2020  3:00 PM Hurshel Party, CNM [facility]  10/25/2020 12:00 AM [facility] SCHED ROOM [facility] None  11/06/2020  9:15 AM [facility]    Venia Carbon, NP

## 2020-10-17 NOTE — Patient Instructions (Signed)
The [employer]  This circuit takes at least 90 minutes to complete so clear your schedule and make mental preparations so you can relax in your environment. The second step requires a lot of pillows so gather them up before beginning Before starting, you should empty your bladder! Have a nice drink nearby, and make sure it has a straw! If you are having contractions, this circuit should be done through contractions, try not to change positions between steps Before you begin...  "I named this 'circuit' after my friend Mary [name], who shared and discussed it with me when I was working with a client whose labor seemed to be stalled out and no longer progressing... This circuit is useful to help get the baby lined up, ideally, in the "Left Occiput Anterior" (LOA) Position, both before labor begins and when some corrections need to be done during labor. Prenatally, this position set can help to rotate a baby. As a natural method of induction, this can help get things going if baby just needed a gentle nudge of position to set things off. To the best of my knowledge, this group of positions will not "hurt" a baby that is already lined up correctly." - Sharon Muza   Step One: Open-knee Chest Stay in this position for 30 minutes, start in cat/cow, then drop your chest as low as you can to the bed or the floor and your bottom as high as you can. Knees should be fairly wide apart, and the angle between the torso/thighs should be wider than 90 degrees. Wiggle around, prop with lots of pillows and use this time to get totally relaxed. This position allows the baby to scoot out of the pelvis a bit and gives them room to rotate, shift their head position, etc. If the pregnant person finds it helpful, careful positioning with a rebozo under the belly, with gentle tension from a support person behind can help maintain this position for the full 30 minutes.  Step Two:Exaggerated Left Side  Lying Roll to your left side, bringing your top leg as high as possible and keeping your bottom leg straight. Roll forward as much as possible, again using a lot of pillows. Sink into the bed and relax some more. If you fall asleep, that's totally okay and you can stay there! If not, stay here for at least another half an hour. Try and get your top right leg up towards your head and get as rolled over onto your belly as much as possible. If you repeat the circuit during labor, try alternating left and right sides. We know the photo the left is actually right side... just flip the image in your head.  Step Three: Moving and Lunges Lunge, walk stairs facing sideways, 2 at a time, (have a spotter downstairs of you!), take a walk outside with one foot on the curb and the other on the street, sit on a birth ball and hula- anything that's upright and putting your pelvis in open, asymmetrical positions. Spend at least 30 minutes doing this one as well to give your baby a chance to move down. If you are lunging or stair or curb walking, you should lunge/walk/go up stairs in the direction that feels better to you. The key with the lunge is that the toes of the higher leg and mom's belly button should be at right angles. Do not lunge over your knee, that closes the pelvis.     Mary Hamilton [name]: Circuit Creator - [email or web address] Sharon   Muza, CD, BDT (DONA), LCCE, FACCE: Supporting Content - [email or web address] Emily Weaver Brown: Photography - [email or web address] Kate Dewey CD/CDT (BAI): Print and Webmaster - [email or web address] [name] [email or web address]  

## 2020-10-22 ENCOUNTER — Telehealth (HOSPITAL_COMMUNITY): Payer: Self-pay | Admitting: *Deleted

## 2020-10-22 NOTE — Telephone Encounter (Signed)
Preadmission screen  

## 2020-10-23 ENCOUNTER — Encounter (HOSPITAL_COMMUNITY): Payer: Self-pay | Admitting: Family Medicine

## 2020-10-23 ENCOUNTER — Other Ambulatory Visit (HOSPITAL_COMMUNITY)
Admission: RE | Admit: 2020-10-23 | Discharge: 2020-10-23 | Disposition: A | Discharge status: Home / Self Care | Payer: Medicaid Other | Source: Ambulatory Visit | Attending: Advanced Practice Midwife | Admitting: Advanced Practice Midwife

## 2020-10-23 ENCOUNTER — Telehealth (HOSPITAL_COMMUNITY): Payer: Self-pay | Admitting: *Deleted

## 2020-10-23 ENCOUNTER — Inpatient Hospital Stay (HOSPITAL_COMMUNITY)
Admission: AD | Admit: 2020-10-23 | Discharge: 2020-10-26 | DRG: 806 | Disposition: A | Discharge status: Home / Self Care | Payer: Medicaid Other | Attending: Family Medicine | Admitting: Family Medicine

## 2020-10-23 ENCOUNTER — Other Ambulatory Visit: Payer: Self-pay | Admitting: Advanced Practice Midwife

## 2020-10-23 ENCOUNTER — Other Ambulatory Visit: Payer: Self-pay

## 2020-10-23 ENCOUNTER — Ambulatory Visit (INDEPENDENT_AMBULATORY_CARE_PROVIDER_SITE_OTHER): Payer: Medicaid Other | Admitting: Advanced Practice Midwife

## 2020-10-23 VITALS — BP 122/75 | HR 91 | Wt 236.0 lb

## 2020-10-23 DIAGNOSIS — O99019 Anemia complicating pregnancy, unspecified trimester: Secondary | ICD-10-CM | POA: Diagnosis present

## 2020-10-23 DIAGNOSIS — D649 Anemia, unspecified: Secondary | ICD-10-CM | POA: Diagnosis present

## 2020-10-23 DIAGNOSIS — B373 Candidiasis of vulva and vagina: Secondary | ICD-10-CM

## 2020-10-23 DIAGNOSIS — A6000 Herpesviral infection of urogenital system, unspecified: Secondary | ICD-10-CM | POA: Diagnosis present

## 2020-10-23 DIAGNOSIS — O134 Gestational [pregnancy-induced] hypertension without significant proteinuria, complicating childbirth: Secondary | ICD-10-CM | POA: Diagnosis present

## 2020-10-23 DIAGNOSIS — O26893 Other specified pregnancy related conditions, third trimester: Secondary | ICD-10-CM | POA: Diagnosis present

## 2020-10-23 DIAGNOSIS — F32A Depression, unspecified: Secondary | ICD-10-CM | POA: Diagnosis present

## 2020-10-23 DIAGNOSIS — O480 Post-term pregnancy: Secondary | ICD-10-CM | POA: Diagnosis present

## 2020-10-23 DIAGNOSIS — N898 Other specified noninflammatory disorders of vagina: Secondary | ICD-10-CM | POA: Diagnosis present

## 2020-10-23 DIAGNOSIS — O9902 Anemia complicating childbirth: Secondary | ICD-10-CM | POA: Diagnosis present

## 2020-10-23 DIAGNOSIS — O99210 Obesity complicating pregnancy, unspecified trimester: Secondary | ICD-10-CM | POA: Diagnosis present

## 2020-10-23 DIAGNOSIS — O99344 Other mental disorders complicating childbirth: Secondary | ICD-10-CM | POA: Diagnosis present

## 2020-10-23 DIAGNOSIS — O368130 Decreased fetal movements, third trimester, not applicable or unspecified: Secondary | ICD-10-CM | POA: Diagnosis present

## 2020-10-23 DIAGNOSIS — Z8616 Personal history of COVID-19: Secondary | ICD-10-CM | POA: Diagnosis not present

## 2020-10-23 DIAGNOSIS — A5901 Trichomonal vulvovaginitis: Secondary | ICD-10-CM | POA: Diagnosis present

## 2020-10-23 DIAGNOSIS — O9832 Other infections with a predominantly sexual mode of transmission complicating childbirth: Secondary | ICD-10-CM | POA: Diagnosis present

## 2020-10-23 DIAGNOSIS — O98513 Other viral diseases complicating pregnancy, third trimester: Secondary | ICD-10-CM

## 2020-10-23 DIAGNOSIS — U071 COVID-19: Secondary | ICD-10-CM

## 2020-10-23 DIAGNOSIS — R030 Elevated blood-pressure reading, without diagnosis of hypertension: Secondary | ICD-10-CM

## 2020-10-23 DIAGNOSIS — O0930 Supervision of pregnancy with insufficient antenatal care, unspecified trimester: Secondary | ICD-10-CM

## 2020-10-23 DIAGNOSIS — Z3480 Encounter for supervision of other normal pregnancy, unspecified trimester: Secondary | ICD-10-CM

## 2020-10-23 DIAGNOSIS — Z3A40 40 weeks gestation of pregnancy: Secondary | ICD-10-CM

## 2020-10-23 DIAGNOSIS — Z3483 Encounter for supervision of other normal pregnancy, third trimester: Secondary | ICD-10-CM | POA: Diagnosis not present

## 2020-10-23 DIAGNOSIS — O139 Gestational [pregnancy-induced] hypertension without significant proteinuria, unspecified trimester: Secondary | ICD-10-CM | POA: Diagnosis present

## 2020-10-23 DIAGNOSIS — B009 Herpesviral infection, unspecified: Secondary | ICD-10-CM | POA: Diagnosis present

## 2020-10-23 DIAGNOSIS — F419 Anxiety disorder, unspecified: Secondary | ICD-10-CM | POA: Diagnosis present

## 2020-10-23 DIAGNOSIS — O368190 Decreased fetal movements, unspecified trimester, not applicable or unspecified: Secondary | ICD-10-CM | POA: Diagnosis present

## 2020-10-23 DIAGNOSIS — B3731 Acute candidiasis of vulva and vagina: Secondary | ICD-10-CM

## 2020-10-23 LAB — COMPREHENSIVE METABOLIC PANEL
ALT: 14 U/L (ref 0–44)
AST: 23 U/L (ref 15–41)
Albumin: 3 g/dL — ABNORMAL LOW (ref 3.5–5.0)
Alkaline Phosphatase: 165 U/L — ABNORMAL HIGH (ref 38–126)
Anion gap: 9 (ref 5–15)
BUN: 8 mg/dL (ref 6–20)
CO2: 18 mmol/L — ABNORMAL LOW (ref 22–32)
Calcium: 8.7 mg/dL — ABNORMAL LOW (ref 8.9–10.3)
Chloride: 109 mmol/L (ref 98–111)
Creatinine, Ser: 0.5 mg/dL (ref 0.44–1.00)
GFR, Estimated: >60 mL/min (ref >60)
Glucose, Bld: 89 mg/dL (ref 70–99)
Potassium: 3.1 mmol/L — ABNORMAL LOW (ref 3.5–5.1)
Sodium: 136 mmol/L (ref 135–145)
Total Bilirubin: 0.7 mg/dL (ref 0.3–1.2)
Total Protein: 6.1 g/dL — ABNORMAL LOW (ref 6.5–8.1)

## 2020-10-23 LAB — CBC
HCT: 36.4 % (ref 36.0–46.0)
Hemoglobin: 11.8 g/dL — ABNORMAL LOW (ref 12.0–15.0)
MCH: 27.8 pg (ref 26.0–34.0)
MCHC: 32.4 g/dL (ref 30.0–36.0)
MCV: 85.8 fL (ref 80.0–100.0)
Platelets: 161 10*3/uL (ref 150–400)
RBC: 4.24 MIL/uL (ref 3.87–5.11)
RDW: 16 % — ABNORMAL HIGH (ref 11.5–15.5)
WBC: 9.9 10*3/uL (ref 4.0–10.5)
nRBC: 0 % (ref 0.0–0.2)

## 2020-10-23 LAB — TYPE AND SCREEN
ABO/RH(D): O POS
Antibody Screen: NEGATIVE

## 2020-10-23 MED ORDER — MISOPROSTOL 25 MCG QUARTER TABLET
25.0000 ug | ORAL_TABLET | ORAL | Status: DC | PRN
  Administered 2020-10-23: 25 ug via VAGINAL
  Filled 2020-10-23 (×2): qty 1

## 2020-10-23 MED ORDER — OXYCODONE-ACETAMINOPHEN 5-325 MG PO TABS: 2.0000 | ORAL_TABLET | ORAL | Status: DC | PRN

## 2020-10-23 MED ORDER — OXYTOCIN-SODIUM CHLORIDE 30-0.9 UT/500ML-% IV SOLN: 2.5000 [IU]/h | INTRAVENOUS | Status: DC

## 2020-10-23 MED ORDER — LACTATED RINGERS IV SOLN: INTRAVENOUS | Status: DC

## 2020-10-23 MED ORDER — LACTATED RINGERS IV SOLN: 500.0000 mL | INTRAVENOUS | Status: DC | PRN

## 2020-10-23 MED ORDER — FLUCONAZOLE 150 MG PO TABS: 150.0000 mg | ORAL_TABLET | Freq: Once | ORAL | 1 refills | Status: DC

## 2020-10-23 MED ORDER — SOD CITRATE-CITRIC ACID 500-334 MG/5ML PO SOLN: 30.0000 mL | ORAL | Status: DC | PRN

## 2020-10-23 MED ORDER — ONDANSETRON HCL 4 MG/2ML IJ SOLN: 4.0000 mg | Freq: Four times a day (QID) | INTRAMUSCULAR | Status: DC | PRN

## 2020-10-23 MED ORDER — LIDOCAINE HCL (PF) 1 % IJ SOLN: 30.0000 mL | INTRAMUSCULAR | Status: DC | PRN

## 2020-10-23 MED ORDER — TERBUTALINE SULFATE 1 MG/ML IJ SOLN: 0.2500 mg | Freq: Once | INTRAMUSCULAR | Status: DC | PRN

## 2020-10-23 MED ORDER — OXYCODONE-ACETAMINOPHEN 5-325 MG PO TABS: 1.0000 | ORAL_TABLET | ORAL | Status: DC | PRN

## 2020-10-23 MED ORDER — OXYTOCIN BOLUS FROM INFUSION
333.0000 mL | Freq: Once | INTRAVENOUS | Status: AC
  Administered 2020-10-24: 333 mL via INTRAVENOUS

## 2020-10-23 MED ORDER — ACETAMINOPHEN 325 MG PO TABS: 650.0000 mg | ORAL_TABLET | ORAL | Status: DC | PRN

## 2020-10-23 NOTE — Progress Notes (Signed)
PRENATAL VISIT NOTE  Subjective:  Mary [name] is a 25 y.o. [phone] at [redacted]w[redacted]d being seen today for ongoing prenatal care.  She is currently monitored for the following issues for this low-risk pregnancy and has Anxiety disorder of adolescence; Supervision of other normal pregnancy, antepartum; Herpes simplex type 2 (HSV-2) infection affecting pregnancy, antepartum; Late prenatal care affecting pregnancy, antepartum; Obesity during pregnancy, antepartum; Trichomonal vaginitis during pregnancy in second trimester; Unintentional Tylenol overdose; Anemia in pregnancy; COVID-19 affecting pregnancy in third trimester; Decreased fetal movement; and Left against medical advice on their problem list.  Patient reports occasional contractions.  Contractions: Not present. Vag. Bleeding: None.  Movement: Present. Denies leaking of fluid.   The following portions of the patient's history were reviewed and updated as appropriate: allergies, current medications, past family history, past medical history, past social history, past surgical history and problem list.   Objective:   Vitals:   10/23/20 1437  BP: 122/75  Pulse: 91  Weight: 236 lb (107 kg)    Fetal Status: Fetal Heart Rate (bpm): 134   Movement: Present     General:  Alert, oriented and cooperative. Patient is in no acute distress.  Skin: Skin is warm and dry. No rash noted.   Cardiovascular: Normal heart rate noted  Respiratory: Normal respiratory effort, no problems with respiration noted  Abdomen: Soft, gravid, appropriate for gestational age.  Pain/Pressure: Present     Pelvic: Cervical exam performed in the presence of a chaperone        Extremities: Normal range of motion.  Edema: None  Mental Status: Normal mood and affect. Normal behavior. Normal judgment and thought content.   Assessment and Plan:  Pregnancy: G4P3003 at [redacted]w[redacted]d 1. Vaginal discharge --itching and thick white vaginal discharge per pt and seen on today's exam -  Cervicovaginal ancillary only( [place])  2. Supervision of other normal pregnancy, antepartum --Pt was sent to MAU on 1/18 after virtual visit with report of DFM. Pt was also COVID positive on 1/15.   --FHR tracing on 1/18 was reactive but pt continued to report DFM so admission and IOL was recommended. Pt left AMA but returned later that night for IOL. The induction was started and she was given Cytotec PO. She declined foley bulb. She had diarrhea with PO Cytotec and did not want to stay so she left AMA again on 1/19. --She had visit in the office on 1/26 and agreed to foley bulb outpatient on today's visit 2/1 with IOL on 2/3.  Today she declines foley bulb. --After long discussion with both patient and her s/o, she agrees to membrane sweep today in the office with IOL scheduled 2/5 or 2/6. --NST performed and was not reactive for 12 minutes. After pt drank water and turned to left side, NST was reactive with multiple accelerations. --I recommended pt be induced today or keep IOL on 2/3 but not wait until 2/5 or 2/6 given all the factors, recent COVID, recent episodes of DFM, isolated HTN without diagnosis, and delay on reactive NST today at post term. --Pt agrees to IOL today.     3. COVID-19 affecting pregnancy in third trimester --No symptoms post COVID, good fetal movement today  4. Vaginal candidiasis --Rx for Diflucan, 1 dose with 1 refill.   6. Elevated BP without diagnosis of hypertension --BP wnl today  Term labor symptoms and general obstetric precautions including but not limited to vaginal bleeding, contractions, leaking of fluid and fetal movement were reviewed in detail with  the patient. Please refer to After Visit Summary for other counseling recommendations.   No follow-ups on file.  Future Appointments  Date Time Provider Department Center  10/25/2020 12:00 AM [facility] SCHED ROOM [facility] None  11/06/2020  9:15 AM [facility]    Sharen Counter, CNM

## 2020-10-23 NOTE — Telephone Encounter (Signed)
Preadmission screen  

## 2020-10-23 NOTE — H&P (Signed)
OBSTETRIC ADMISSION HISTORY AND PHYSICAL  Mary [name] is a 25 y.o. female [phone] with IUP at [redacted]w[redacted]d by LMP presenting for IOL. Patient initially presented to clinic 10/09/20 with DFM and was sent to MAU for further evaluation. She continued to have DFM with overall reassuring FHT, reactive NST. She additionally had a BP of 133/91 in MAU but was otherwise asymptomatic at that time. Decision made to admit her at that time, patient received cytotec x5 and then elected to leave AMA. She was then seen in clinic 1/26 and offered FB at that time but declined. She was then seen today 2/21 in clinic and had NRNST for the first 12 min. She was then hydrated and had reassuring NST after that. Given her history and that she was post term it was recommended that she go to L&D for induction. She reports no LOF, no VB, no blurry vision, headaches or peripheral edema, and RUQ pain.  She plans on breast feeding. She request paragard outpatient for birth control. She received her prenatal care at [place]: By LMP --->  Estimated Date of Delivery: 10/20/20  Sono:    09/19/20$RemoveB'@[redacted]w[redacted]d'mPbGYYiF$ , CWD, normal anatomy, cephalic presentation, 3474Q, 34% EFW   Prenatal History/Complications:  HSV (on suppression) Late PNC Trichomonas in pregnancy (neg TOC) Anemia in pregnancy (admit hgb pending, on PO iron) COVID + (diagnosed 10/06/20)  Past Medical History: Past Medical History:  Diagnosis Date  . Anxiety   . Asthma    as a child  . Depression   . History of chlamydia   . Pregnancy induced hypertension   . Trichomonas     Past Surgical History: Past Surgical History:  Procedure Laterality Date  . NO PAST SURGERIES      Obstetrical History: OB History    Gravida  4   Para  3   Term  3   Preterm  0   AB  0   Living  3     SAB  0   IAB  0   Ectopic  0   Multiple  0   Live Births  3           Social History Social History   Socioeconomic History  . Marital status: Single    Spouse  name: Not on file  . Number of children: Not on file  . Years of education: Not on file  . Highest education level: Not on file  Occupational History  . Not on file  Tobacco Use  . Smoking status: Never Smoker  . Smokeless tobacco: Never Used  Vaping Use  . Vaping Use: Never used  Substance and Sexual Activity  . Alcohol use: No  . Drug use: No  . Sexual activity: Yes    Birth control/protection: None  Other Topics Concern  . Not on file  Social History Narrative  . Not on file   Social Determinants of Health   Financial Resource Strain: Not on file  Food Insecurity: Not on file  Transportation Needs: Not on file  Physical Activity: Not on file  Stress: Not on file  Social Connections: Not on file    Family History: Family History  Problem Relation Age of Onset  . Cancer Maternal Grandmother   . Stroke Maternal Grandmother   . Depression Mother   . Bipolar disorder Mother   . Diabetes type II Mother   . Seizures Mother   . Hypertension Mother   . Obesity Mother   . Diabetes Mother   .  Mental illness Mother   . Cancer Maternal Uncle     Allergies: No Known Allergies  Medications Prior to Admission  Medication Sig Dispense Refill Last Dose  . acetaminophen (TYLENOL) 325 MG tablet Take 2 tablets (650 mg total) by mouth every 4 (four) hours as needed (for pain scale < 4). 90 tablet 0   . aspirin EC 81 MG tablet Take 1 tablet (81 mg total) by mouth daily. Take after 12 weeks for prevention of preeclampsia later in pregnancy 300 tablet 0   . fluconazole (DIFLUCAN) 150 MG tablet Take 1 tablet (150 mg total) by mouth once for 1 dose. 1 tablet 1   . Iron, Ferrous Sulfate, 325 (65 Fe) MG TABS Take 1 tablet by mouth daily with breakfast. 30 tablet 2   . Prenatal Vit-Fe Fumarate-FA (PRENATAL MULTIVITAMIN) TABS Take 1 tablet by mouth daily.       Marland Kitchen senna-docusate (SENOKOT-S) 8.6-50 MG tablet Take 1 tablet by mouth at bedtime. (Patient not taking: No sig reported) 60 tablet  0   . valACYclovir (VALTREX) 500 MG tablet Take 1 tablet (500 mg total) by mouth 2 (two) times daily. Start at 36 weeks 90 tablet 1      Review of Systems   All systems reviewed and negative except as stated in HPI  Height 5' 5"  (1.651 m), weight 108.8 kg, last menstrual period 01/14/2020, unknown if currently breastfeeding. General appearance: alert, cooperative and no distress Lungs: normal respiratory effort Heart: regular rate and rhythm Abdomen: soft, non-tender; gravid Pelvic: as noted below Extremities: Homans sign is negative, no sign of DVT Presentation: cephalic by cervical exam Fetal monitoringBaseline: 140 bpm, Variability: Good {> 6 bpm), Accelerations: Reactive and Decelerations: Absent Uterine activity q4-5 min     Prenatal labs: ABO, Rh: --/--/O POS (01/19 4665) Antibody: NEG (01/19 0447) Rubella: 1.60 (10/19 1120) RPR: NON REACTIVE (01/19 0447)  HBsAg: NON-REACTIVE (10/19 1120)  HIV: NON-REACTIVE (11/17 0000)  GBS: Negative/-- (01/04 0000)  2 hr Glucola passed Genetic screening  normal Anatomy US normal but limited given body habitus  Prenatal Transfer Tool  Maternal Diabetes: No Genetic Screening: Normal Maternal Ultrasounds/Referrals: Normal Fetal Ultrasounds or other Referrals:  None Maternal Substance Abuse:  No Significant Maternal Medications:  None Significant Maternal Lab Results: Group B Strep negative  No results found for this or any previous visit (from the past 24 hour(s)).  Patient Active Problem List   Diagnosis Date Noted  . Post term pregnancy over 40 weeks 10/23/2020  . Decreased fetal movement 10/10/2020  . Left against medical advice 10/10/2020  . COVID-19 affecting pregnancy in third trimester 10/09/2020  . Anemia in pregnancy 08/12/2020  . Unintentional Tylenol overdose 08/08/2020  . Trichomonal vaginitis during pregnancy in second trimester 07/12/2020  . Supervision of other normal pregnancy, antepartum 07/10/2020  .  Herpes simplex type 2 (HSV-2) infection affecting pregnancy, antepartum 07/10/2020  . Late prenatal care affecting pregnancy, antepartum 07/10/2020  . Obesity during pregnancy, antepartum 07/10/2020  . Anxiety disorder of adolescence 11/05/2011    Assessment/Plan:  Mary [name] is a 25 y.o. [phone] at 34w4dhere for IOL.  #IOL: Patient previously sent from clinic to MAU 10/09/20 given DFM and was found to have reactive NST at that time. Given elevated DBP 90s recommendation to admit patient at that time. Patient was admitted after leaving MAU AMA and presented to L&D for IOL. After cyto x5 at that time patient elected to leave L&D AMA. She has subsequently been seen twice in clinic  and today had NRNST for the first 12 min which subsequently improved. Given her history and GA decision made for IOL at this time. Discussed IOL process with patient. Given cervical exam will start with cytotec. Discussed FB placement with patient, refusing at this time.  #Pain: PRN #FWB: Cat I #ID: GBS neg #MOF: breast #MOC: paragard @pp  appt  #Elevated BP: BP on admit WNL. BP previously elevated on prior admission. Has not met criteria for gHTN. preE labs pending.  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  #Late PNC: patient transferred prenatal care from [facility] in [place] to [facility]. She did not start [facility] until October 2021.  #Anemia of pregnancy: hgb on admit pending. Previously on PO iron outpatient.   Arrie Senate, MD  10/23/2020, 9:24 PM

## 2020-10-24 ENCOUNTER — Encounter (HOSPITAL_COMMUNITY): Payer: Self-pay | Admitting: Family Medicine

## 2020-10-24 DIAGNOSIS — Z3A40 40 weeks gestation of pregnancy: Secondary | ICD-10-CM

## 2020-10-24 DIAGNOSIS — O139 Gestational [pregnancy-induced] hypertension without significant proteinuria, unspecified trimester: Secondary | ICD-10-CM | POA: Diagnosis present

## 2020-10-24 LAB — RPR: RPR Ser Ql: NONREACTIVE

## 2020-10-24 LAB — CERVICOVAGINAL ANCILLARY ONLY
Bacterial Vaginitis (gardnerella): NEGATIVE
Candida Glabrata: NEGATIVE
Candida Vaginitis: POSITIVE — AB
Chlamydia: NEGATIVE
Comment: NEGATIVE
Comment: NEGATIVE
Comment: NEGATIVE
Comment: NEGATIVE
Comment: NEGATIVE
Comment: NORMAL
Neisseria Gonorrhea: NEGATIVE
Trichomonas: NEGATIVE

## 2020-10-24 LAB — PROTEIN / CREATININE RATIO, URINE
Creatinine, Urine: 181.74 mg/dL
Protein Creatinine Ratio: 0.13 mg/mg{Cre} (ref 0.00–0.15)
Total Protein, Urine: 24 mg/dL

## 2020-10-24 MED ORDER — SIMETHICONE 80 MG PO CHEW: 80.0000 mg | CHEWABLE_TABLET | ORAL | Status: DC | PRN

## 2020-10-24 MED ORDER — FENTANYL CITRATE (PF) 100 MCG/2ML IJ SOLN
100.0000 ug | INTRAMUSCULAR | Status: DC | PRN
  Administered 2020-10-24 (×2): 100 ug via INTRAVENOUS
  Filled 2020-10-24 (×2): qty 2

## 2020-10-24 MED ORDER — ONDANSETRON HCL 4 MG/2ML IJ SOLN: 4.0000 mg | INTRAMUSCULAR | Status: DC | PRN

## 2020-10-24 MED ORDER — IBUPROFEN 600 MG PO TABS
600.0000 mg | ORAL_TABLET | Freq: Four times a day (QID) | ORAL | Status: DC
  Administered 2020-10-24 – 2020-10-26 (×8): 600 mg via ORAL
  Filled 2020-10-24 (×9): qty 1

## 2020-10-24 MED ORDER — MEASLES, MUMPS & RUBELLA VAC IJ SOLR: 0.5000 mL | Freq: Once | INTRAMUSCULAR | Status: DC

## 2020-10-24 MED ORDER — PRENATAL MULTIVITAMIN CH
1.0000 | ORAL_TABLET | Freq: Every day | ORAL | Status: DC
  Administered 2020-10-24 – 2020-10-26 (×3): 1 via ORAL
  Filled 2020-10-24 (×3): qty 1

## 2020-10-24 MED ORDER — SENNOSIDES-DOCUSATE SODIUM 8.6-50 MG PO TABS
2.0000 | ORAL_TABLET | ORAL | Status: DC
  Administered 2020-10-24 – 2020-10-26 (×3): 2 via ORAL
  Filled 2020-10-24 (×3): qty 2

## 2020-10-24 MED ORDER — ONDANSETRON HCL 4 MG PO TABS: 4.0000 mg | ORAL_TABLET | ORAL | Status: DC | PRN

## 2020-10-24 MED ORDER — DIBUCAINE (PERIANAL) 1 % EX OINT: 1.0000 "application " | TOPICAL_OINTMENT | CUTANEOUS | Status: DC | PRN

## 2020-10-24 MED ORDER — TETANUS-DIPHTH-ACELL PERTUSSIS 5-2.5-18.5 LF-MCG/0.5 IM SUSY: 0.5000 mL | PREFILLED_SYRINGE | Freq: Once | INTRAMUSCULAR | Status: DC

## 2020-10-24 MED ORDER — SERTRALINE HCL 25 MG PO TABS
25.0000 mg | ORAL_TABLET | Freq: Every day | ORAL | Status: DC
  Administered 2020-10-24 – 2020-10-26 (×3): 25 mg via ORAL
  Filled 2020-10-24 (×3): qty 1

## 2020-10-24 MED ORDER — OXYTOCIN-SODIUM CHLORIDE 30-0.9 UT/500ML-% IV SOLN
1.0000 m[IU]/min | INTRAVENOUS | Status: DC
  Administered 2020-10-24: 2 m[IU]/min via INTRAVENOUS

## 2020-10-24 MED ORDER — DIPHENHYDRAMINE HCL 25 MG PO CAPS: 25.0000 mg | ORAL_CAPSULE | Freq: Four times a day (QID) | ORAL | Status: DC | PRN

## 2020-10-24 MED ORDER — WITCH HAZEL-GLYCERIN EX PADS: 1.0000 "application " | MEDICATED_PAD | CUTANEOUS | Status: DC | PRN

## 2020-10-24 MED ORDER — OXYTOCIN-SODIUM CHLORIDE 30-0.9 UT/500ML-% IV SOLN
INTRAVENOUS | Status: AC
  Filled 2020-10-24: qty 500

## 2020-10-24 MED ORDER — COCONUT OIL OIL: 1.0000 "application " | TOPICAL_OIL | Status: DC | PRN

## 2020-10-24 MED ORDER — FLUCONAZOLE 150 MG PO TABS
150.0000 mg | ORAL_TABLET | Freq: Every day | ORAL | Status: DC
  Administered 2020-10-24 – 2020-10-26 (×3): 150 mg via ORAL
  Filled 2020-10-24 (×3): qty 1

## 2020-10-24 MED ORDER — TERBUTALINE SULFATE 1 MG/ML IJ SOLN: 0.2500 mg | Freq: Once | INTRAMUSCULAR | Status: DC | PRN

## 2020-10-24 MED ORDER — ACETAMINOPHEN 325 MG PO TABS
650.0000 mg | ORAL_TABLET | ORAL | Status: DC | PRN
  Administered 2020-10-24 – 2020-10-25 (×4): 650 mg via ORAL
  Filled 2020-10-24 (×4): qty 2

## 2020-10-24 MED ORDER — BENZOCAINE-MENTHOL 20-0.5 % EX AERO
1.0000 "application " | INHALATION_SPRAY | CUTANEOUS | Status: DC | PRN
  Administered 2020-10-24 – 2020-10-26 (×2): 1 via TOPICAL
  Filled 2020-10-24 (×2): qty 56

## 2020-10-24 NOTE — Discharge Instructions (Signed)

## 2020-10-24 NOTE — Lactation Note (Signed)
This note was copied from a baby's chart. Lactation Consultation Note  Patient Name: Mary [name] VCBSW'H Date: 10/24/2020    [facility] checked in with RN, Tiara Little, to follow up on need for supplementation since shift came to an end and I was not able to see patient to assess.

## 2020-10-24 NOTE — Lactation Note (Signed)
This note was copied from a baby's chart. Lactation Consultation Note  Patient Name: Mary [name] Date: 10/24/2020 Reason for consult: Initial assessment;Term Age:26 hours   Mom has BF exp. 4 months with her 88 year old.  She had nipple pain throughout the BF exp but was unable to identify source.  She has WIC in [facility].  +breast changes during pregnancy.    Mom desired to feed during LC visit.  Infant was swaddled in crib.  LC reviewed hand expression; mom was able to hand express drops onto nipple.  She told me she was going to record the feeding on her phone. She has an APP on her phone she used during pregnancy and will use postpartum.  Mom states her nipples are tender, (esp. The right nipple).  Both appear normal at this point.   Infant doesn't open her mouth wide when latching.  Laid back position attempted.  Mom has large breasts easily compressible.  Rolled cloth used to support breast.  Infant latched, chin was tugged gently to allow a wider gape.  Swallows were heard with compression.  Mom began cramping and had a great amount of pain.  The nipple pain was a 5.  Mom did not want LC to end the feed.  We discussed how to break a latch.  Infant fed for 10 minutes; nipple slightly compressed but mostly rounded when infant was unlatched.  Mom latched infant on the other side with difficulty at first.  Once latched, infant fed 4 minutes, then mom had to use the restroom.  LC burped and swaddled infant placing her in the crib.    Hand pump provided for mom. Parts explained.  Flange size appropriate.   LC and mom discussed supplementing with formula so dad could feed infant while mom rested.   Mom was engaged with [facility] and with infant throughout consult.  LC highly encouraged her to see an OP LC if she continues to have pain after DC.  Brochure provided and mom is aware of support groups, OP LC appt. And phone line.     Maternal Data Has patient been taught  Hand Expression?: Yes Does the patient have breastfeeding experience prior to this delivery?: Yes How long did the patient breastfeed?: 4 months per mom  Feeding Mother's Current Feeding Choice: Breast Milk and Formula  LATCH Score Latch: Repeated attempts needed to sustain latch, nipple held in mouth throughout feeding, stimulation needed to elicit sucking reflex.  Audible Swallowing: A few with stimulation  Type of Nipple: Everted at rest and after stimulation  Comfort (Breast/Nipple): Filling, red/small blisters or bruises, mild/mod discomfort  Hold (Positioning): Assistance needed to correctly position infant at breast and maintain latch.  LATCH Score: 6   Lactation Tools Discussed/Used Tools: Pump Breast pump type: Manual (provided for mom for home use)  Interventions Interventions: Breast feeding basics reviewed;Assisted with latch;Skin to skin;Breast massage;Hand express;Position options;Support pillows;Breast compression;Adjust position  Discharge Pump: Manual WIC Program: Yes  Consult Status Consult Status: Follow-up Date: 10/25/20 Follow-up type: In-patient    [name] [facility] 10/24/2020, 11:18 PM

## 2020-10-24 NOTE — Progress Notes (Signed)
Labor Progress Note Mary [name] is a 25 y.o. [phone] at [redacted]w[redacted]d presented for IOL-prior DFM, NRNST, and gHTN.  S: Strip reviewed.  O:  BP (!) 147/100   Pulse 94   Temp 97.8 F (36.6 C) (Oral)   Resp 18   Ht 5\' 5"  (1.651 m)   Wt 108.8 kg   LMP 01/14/2020   BMI 39.92 kg/m  EFM: baseline 130bpm/mod variability/+accels/no decels Toco: q1-60min  CVE: Dilation: 4 Effacement (%): 70 Station: -2 Presentation: Vertex Exam by:: [email or web address], RN   A&P: 25 y.o. 22 [redacted]w[redacted]d IOL-prior DFM, NRNST, and gHTN. #IOL: S/p cytox1. Pitocin started @0200 . Continue to titrate. AROM when able. #Pain: PRN #FWB: cat 1 #GBS negative   #gHTN: Patient meets criteria for gHTN on admit. Asymptomatic. preE labs nml. Continue to monitor.  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  #Late PNC: patient transferred prenatal care from [facility] in [place] to [facility]. She did not start [facility] until October 2021. SW consult postpartum.  #Anemia of pregnancy: hgb on admit 11.8. Previously on PO iron outpatient.   [facility], MD 5:42 AM

## 2020-10-24 NOTE — Lactation Note (Signed)
Lactation Consultation Note  Patient Name: Mary [name] [id number] Date: 10/24/2020 Reason for consult: L&D Initial assessment Age:25 y.o.   P4, Consult in L&D. Waited as baby was on scale as LC entered room.  Baby latched easily.  Encouraged STS and breastfeeding on demand.   Maternal Data Has patient been taught Hand Expression?: Yes Does the patient have breastfeeding experience prior to this delivery?: Yes How long did the patient breastfeed?: 6 mos. with last child  Feeding Mother's Current Feeding Choice: Breast Milk and Formula  LATCH Score Latch: Grasps breast easily, tongue down, lips flanged, rhythmical sucking.  Audible Swallowing: A few with stimulation  Type of Nipple: Everted at rest and after stimulation  Comfort (Breast/Nipple): Soft / non-tender  Hold (Positioning): Assistance needed to correctly position infant at breast and maintain latch.  LATCH Score: 8   Lactation Tools Discussed/Used    Interventions Interventions: Assisted with latch;Skin to skin;Adjust position  Discharge    Consult Status Consult Status: Follow-up Date: 10/24/20 Follow-up type: In-patient    Dahlia Byes [facility] 10/24/2020, 7:53 AM

## 2020-10-24 NOTE — Clinical Social Work Maternal (Signed)
CLINICAL SOCIAL WORK MATERNAL/CHILD NOTE  Patient Details  Name: Mary [name] MRN: [id number] Date of Birth: [birth year]  Date:  10/24/2020  Clinical Social Worker Initiating Note:  Darra Lis, MSW, [place] Date/Time: Initiated:  10/24/20/0100     Child's Name:  Mary [name]   Biological Parents:  Mother,Father Dorian Furnace)   Need for Interpreter:  None   Reason for Referral:  Behavioral Health Concerns   Address:  [address] [place] [address]    Phone number:  [phone] (home)     Additional phone number:   Household Members/Support Persons (HM/SP):   Household Member/Support Person 1,Household Member/Support Person 2,Household Member/Support Person 3,Household Member/Support Person 4   HM/SP Name Relationship DOB or Age  HM/SP -1 Dorian Furnace Significant Other 06/19/1991  HM/SP -2 Delisa Adams Paternal Grandmother 81  HM/SP -3 [place] Faith Muhammad Daughter 09/29/2015  HM/SP -4 Mya Muhammad Female 08/16/2017  HM/SP -5        HM/SP -6        HM/SP -7        HM/SP -8          Natural Supports (not living in the home):  Immediate Family,Friends,Extended Family   Professional Supports: Designer, industrial/product (Comment) ([employer]or)   Employment: Unemployed, FOB works for [place] of Work:     Education:  [employer] school graduate   Homebound arranged:    Museum/gallery curator Resources:  Medicaid   Other Resources:  [place] Considerations Which May Impact Care:    Strengths:  Ability to meet basic needs ,Understanding of illness,Pediatrician chosen   Psychotropic Medications:         Pediatrician:    [place] (including [place])  Pediatrician List:   [facility] Other Research scientist (medical))    Pediatrician Fax Number:    Risk Factors/Current Problems:  Mental Health Concerns     Cognitive State:  Alert    Mood/Affect:  Flat ,Calm    CSW Assessment: CSW consulted for "Late prenatal care, mom has left hospital AMA multiple times and had a flat affect during prenatal visits as well as labor. Likely an underlying undiagnosed depression." [name] does not meet SW criteria for late prenatal care considering care was initiated at [redacted]w[redacted]d CSW met with [name] to complete assessment of other concerns and offer support. CSW observed [name] eating, baby in bassinet and FOB on couch. CSW introduced self and role. CSW requested to speak with [name] alone to respect privacy. FOB was understanding and agreed to exit the room. [name] was flat in affect. [name] was delayed in answering questions and spoke quietly. [name] was otherwise appropriate with answering of questions. CSW informed [name] of the reason for consult. [name] was understanding and reported she was diagnosed with depression and anxiety in high school. [name] disclosed she experienced symptoms of both during pregnancy [name] stated speaking with the therapist was helpful for her and she also copes by getting out the house. [name] reported she has been on medication in the past and that she is interested in starting medication, possibly Zoloft postpartum. CSW asked [name] about the Tylenol overdose that occurred in November. [name] reported she took too many pills to relieve a toothache. [name] stated she did not realize she was taking too  many pills at the time and denies it being intentional. CSW asked [name] about leaving AMA last month. [name] reported she left AMA because "I already have 2 babies born in January." [name] stated that was the only reason. CSW inquired about [name] supports. [name] stated her mom, uncle, friends, FOB and elementary school tutor are good supports. [name] reported she is also involved with [facility] in [place] which offers parenting classes and mentorship. [name] stated she started in November or  December after looking them up online. [name] reported she feels she can adequately manage caring for children once she returns home. CSW assessed [name] current emotions and feelings about the pregnancy. [name] reported she is currently feeling okay and felt okay when she found out about the pregnancy. [name] denies any SI, HI or being involved in DV.  [name] stated her two daughters live with her and her oldest son lives with her uncle Ron Agee, also in [place]. [name] stated she still has parental rights of her son and he has been living with his uncle voluntarily since 2016. [name] stated her uncle is currently also watching her daughter. CSW asked [name] if she has ever had any CPS involvement. [name] disclosed she had a CPS case in [facility] last year after leaving her kids with family too long. [name] stated the case was closed without any issues. [name] reported she has adequate resources for baby, including WIC and food stamps.   CSW provided education regarding the baby blues period vs. perinatal mood disorders, discussed treatment and gave resources for mental health follow up if concerns arise.  CSW recommends self-evaluation during the postpartum time period using the New Mom Checklist from Postpartum Progress and encouraged [name] to contact a medical professional if symptoms are noted at any time.   CSW provided review of Sudden Infant Death Syndrome (SIDS) precautions.  [name] reported she does not have a place for baby to sleep yet and stated they are able to purchase a bassinet for baby. CSW reiterated the importance of baby having her own safe space to sleep, [name] was understanding. [name] reported she all other essential needs. [name] identified a pediatrician and denies any barriers to care.  CSW spoke with RN who expressed [name] has been attentive to baby. RN reported FOB has also been respective with interactions.   CSW identifies no further need for intervention and no barriers to discharge at this time.  CSW  Plan/Description:  No Further Intervention Required/No Barriers to Discharge,Perinatal Mood and Anxiety Disorder (PMADs) Education,Sudden Infant Death Syndrome (SIDS) Education,Other Information/Referral to [employer] Education    Waylan Boga, [place] 10/24/2020, 1:46 PM

## 2020-10-24 NOTE — Progress Notes (Signed)
Labor Progress Note Mary [name] is a 25 y.o. [phone] at 40w4dpresented for IOL-prior DFM, NRNST, and elevated BP prior.  S: Doing well without complaints.  O:  BP (!) 140/92   Pulse 86   Temp 98.4 F (36.9 C) (Oral)   Resp 18   Ht 5' 5"  (1.651 m)   Wt 108.8 kg   LMP 01/14/2020   BMI 39.92 kg/m  EFM: baseline 130bpm/mod variability/+accels/no decels Toco: difficult to trace  CVE: Dilation: 4 Effacement (%): 70 Station: -3 Presentation: Vertex Exam by:: Dr. FSylvester Harder  A&P: 25y.o. [id number]-prior DFM, NRNST, and elevated BP prior. #IOL: Good cervical change since cytotec. Will start pitocin and AROM when able. Consider IUPC after AROM given difficulty tracing contractions. #Pain: PRN #FWB: cat 1 #GBS negative #Elevated BP: BP elevated on admit. BP previously elevated on prior admission. Has not met criteria for gHTN. Asymptomatic. preE labs nml.  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  #Late [facility]: [facility]  #Anemia of pregnancy: hgb on admit 11.8. Previously on PO iron outpatient.   AlArrie SenateMD 2:14 AM

## 2020-10-24 NOTE — Discharge Summary (Addendum)
Postpartum Discharge Summary     Patient Name: Mary [name] DOB: Nov 30, 1995 MRN: [id number]  Date of admission: 10/23/2020 Delivery date: 10/24/2020   Delivering provider:  Arrie Senate   Date of discharge: 10/26/2020  Admitting diagnosis: Post term pregnancy over 40 weeks [O48.0] Intrauterine pregnancy: [redacted]w[redacted]d    Secondary diagnosis:  Active Problems:   Supervision of other normal pregnancy, antepartum   Herpes simplex type 2 (HSV-2) infection affecting pregnancy, antepartum   Late prenatal care affecting pregnancy, antepartum   Obesity during pregnancy, antepartum   Trichomonal vaginitis during pregnancy in second trimester   Anemia in pregnancy   COVID-19 affecting pregnancy in third trimester   Decreased fetal movement   Post term pregnancy over [redacted] weeks   Gestational hypertension   Vaginal delivery  Additional problems: none    Discharge diagnosis: Term Pregnancy Delivered and Gestational Hypertension                                              Post partum procedures:none Augmentation: Pitocin and Cytotec Complications: None  Hospital course: Induction of Labor With Vaginal Delivery   25y.o. yo [id number] [id number] admitted to the hospital 10/23/2020 for induction of labor.  Indication for induction: Gestational hypertension and DFM, as well as NRNST in clinic 10/23/20..  Patient had an uncomplicated labor course as follows: Membrane Rupture Time/Date:  6:00 AM   10/24/2020    Delivery Method: Vaginal, Spontaneous   Episiotomy:  None   Lacerations:   None   Details of delivery can be found in separate delivery note.  Patient had a routine postpartum course. Patient is discharged home 10/26/20.  Newborn Data: Birth date: 10/24/2020   Birth time: 6:29 AM   Gender: Female   Living status: Living   Apgars: 9  , 9   Weight: 3430 g    Magnesium Sulfate received: No BMZ received: No Rhophylac:N/A MMR:N/A T-DaP:Given prenatally Flu:  Yes Transfusion:No  Physical exam  Vitals:   10/25/20 0534 10/25/20 1450 10/25/20 2111 10/26/20 0618  BP: (!) 139/99 140/82 (!) 141/64 (!) 140/99  Pulse: 76 82 83 91  Resp: 17 20 18 16   Temp: 98.2 F (36.8 C) 98 F (36.7 C) 98.2 F (36.8 C) 98.1 F (36.7 C)  TempSrc: Oral Oral Oral Oral  SpO2: 100%  100% 100%  Weight:      Height:       General: alert, cooperative and no distress Lochia: appropriate Uterine Fundus: firm Incision: N/A DVT Evaluation: No evidence of DVT seen on physical exam. Labs: Lab Results  Component Value Date   WBC 9.9 10/23/2020   HGB 11.8 (L) 10/23/2020   HCT 36.4 10/23/2020   MCV 85.8 10/23/2020   PLT 161 10/23/2020   CMP Latest Ref Rng & Units 10/23/2020  Glucose 70 - 99 mg/dL 89  BUN 6 - 20 mg/dL 8  Creatinine 0.44 - 1.00 mg/dL 0.50  Sodium 135 - 145 mmol/L 136  Potassium 3.5 - 5.1 mmol/L 3.1(L)  Chloride 98 - 111 mmol/L 109  CO2 22 - 32 mmol/L 18(L)  Calcium 8.9 - 10.3 mg/dL 8.7(L)  Total Protein 6.5 - 8.1 g/dL 6.1(L)  Total Bilirubin 0.3 - 1.2 mg/dL 0.7  Alkaline Phos 38 - 126 U/L 165(H)  AST 15 - 41 U/L 23  ALT 0 - 44 U/L 14  Edinburgh Score: Edinburgh Postnatal Depression Scale Screening Tool 10/24/2020  I have been able to laugh and see the funny side of things. 0  I have looked forward with enjoyment to things. 0  I have blamed myself unnecessarily when things went wrong. 0  I have been anxious or worried for no good reason. 2  I have felt scared or panicky for no good reason. 0  Things have been getting on top of me. 0  I have been so unhappy that I have had difficulty sleeping. 0  I have felt sad or miserable. 1  I have been so unhappy that I have been crying. 1  The thought of harming myself has occurred to me. 0  Edinburgh Postnatal Depression Scale Total 4     After visit meds:  Allergies as of 10/26/2020   No Known Allergies     Medication List    STOP taking these medications   aspirin EC 81 MG tablet    fluconazole 150 MG tablet Commonly known as: DIFLUCAN   Iron (Ferrous Sulfate) 325 (65 Fe) MG Tabs     TAKE these medications   acetaminophen 325 MG tablet Commonly known as: Tylenol Take 2 tablets (650 mg total) by mouth every 4 (four) hours as needed (for pain scale < 4).   amLODipine 10 MG tablet Commonly known as: NORVASC Take 1 tablet (10 mg total) by mouth daily.   coconut oil Oil Apply 1 application topically as needed.   ibuprofen 600 MG tablet Commonly known as: ADVIL Take 1 tablet (600 mg total) by mouth every 6 (six) hours as needed.   prenatal multivitamin Tabs tablet Take 1 tablet by mouth daily.   sertraline 25 MG tablet Commonly known as: ZOLOFT Take 1 tablet (25 mg total) by mouth daily.   valACYclovir 500 MG tablet Commonly known as: Valtrex Take 1 tablet (500 mg total) by mouth 2 (two) times daily. Start at 36 weeks        Discharge home in stable condition Infant Feeding: Breast Infant Disposition:home with mother Discharge instruction: per After Visit Summary and Postpartum booklet. Activity: Advance as tolerated. Pelvic rest for 6 weeks.  Diet: routine diet Future Appointments: Future Appointments  Date Time Provider [place]  10/31/2020 11:00 AM [place] [facility]  11/06/2020  9:15 AM [facility]  11/23/2020  9:10 AM Rasch, Artist Pais, NP [facility]   Follow up Visit: Message sent to [facility] 10/24/20 by Sylvester Harder.   Please schedule this patient for a In person postpartum visit in 4 weeks with the following provider: Any provider. Additional Postpartum F/U:BP check 1 week , and mood f/u in 1 week  High risk pregnancy complicated by: HTN Delivery mode:   Vaginal, Spontaneous   Anticipated Birth Control:  IUD, please place at postpartum appt   10/26/2020 Shary Key, DO   GME ATTESTATION:  I saw and evaluated the patient. I agree with the findings and the plan of care as  documented in the resident's note.  Arrie Senate, MD OB Fellow, [place] [facility] [place] 10/26/2020 9:48 AM

## 2020-10-25 ENCOUNTER — Inpatient Hospital Stay (HOSPITAL_COMMUNITY)
Admission: AD | Admit: 2020-10-25 | Payer: Medicaid Other | Source: Home / Self Care | Admitting: Obstetrics and Gynecology

## 2020-10-25 ENCOUNTER — Inpatient Hospital Stay (HOSPITAL_COMMUNITY): Payer: Medicaid Other

## 2020-10-25 MED ORDER — AMLODIPINE BESYLATE 5 MG PO TABS
5.0000 mg | ORAL_TABLET | Freq: Every day | ORAL | Status: DC
  Administered 2020-10-25: 5 mg via ORAL
  Filled 2020-10-25: qty 1

## 2020-10-25 NOTE — Progress Notes (Addendum)
POSTPARTUM PROGRESS NOTE  Subjective: Mary [name] is a 25 y.o. [id number] PPD#1 VD at [redacted]w[redacted]d.  She reports she doing well. No acute events overnight. She denies any problems with ambulating, voiding or po intake. Denies nausea or vomiting. She has passed flatus. Pain is moderately controlled.  Lochia is minimal.  Objective: Blood pressure (!) 139/99, pulse 76, temperature 98.2 F (36.8 C), temperature source Oral, resp. rate 17, height 5\' 5"  (1.651 m), weight 108.8 kg, last menstrual period 01/14/2020, SpO2 100 %, unknown if currently breastfeeding.  Physical Exam:  General: alert, cooperative and no distress Chest: no respiratory distress Abdomen: soft, non-tender  Uterine Fundus: firm and at level of umbilicus Extremities: No calf swelling or tenderness  no edema  Recent Labs    10/23/20 2116  HGB 11.8*  HCT 36.4    Assessment/Plan: Mary [name] is a 25 y.o. 22 PPD#1 VD at [redacted]w[redacted]d for IOL-prior DFM, NRNST, and gHTN.  Routine Postpartum Care: Doing well, pain well-controlled.  -- Continue routine care, lactation support  -- Contraception: outpatient IUD -- Feeding: breast, doing well  #gHTN: BP elevated PP. 139/99 this am.  preE labs normal. Started on Norvasc 5mg  #anxiety & depression: started on Zoloft 25mg . 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 #Late PNC: patient transferred prenatal care from [facility] in [place] to [facility]. She did not start [facility] until October 2021. SW cleared. #Anemia of pregnancy: hgb on admit11.8.   Dispo: Plan for discharge tomorrow, 10/26/20  [facility], DO 10/25/2020 1:11 PM   GME ATTESTATION:  I saw and evaluated the patient. I agree with the findings and the plan of care as documented in the resident's note.  November 2021, MD OB Fellow, Faculty [facility], [facility] for [facility] 10/25/2020 7:58 PM

## 2020-10-25 NOTE — Lactation Note (Signed)
This note was copied from a baby's chart. Lactation Consultation Note Baby 18 hrs old. Mom stated it hurts bad when baby latches. Mom stated it is almost time for baby to feed. Mom would like LC to see latch. LC done chin and lip flange a little wider. Mom stated it hurts even when she is opened wide. No swallows noted. Good breast compressions noted. Mom is breast/formula.  Mom BF her last child a few months, stated it hurt with her as well. Encouraged to call for assistance or questions.  Patient Name: Mary [name] [id number] Date: 10/25/2020 Reason for consult: Nipple pain/trauma Age:43 hours  Maternal Data Has patient been taught Hand Expression?: Yes Does the patient have breastfeeding experience prior to this delivery?: Yes How long did the patient breastfeed?: 4 months per mom  Feeding Mother's Current Feeding Choice: Breast Milk and Formula  LATCH Score Latch: Grasps breast easily, tongue down, lips flanged, rhythmical sucking.  Audible Swallowing: None  Type of Nipple: Everted at rest and after stimulation (short shaft compressible)  Comfort (Breast/Nipple): Filling, red/small blisters or bruises, mild/mod discomfort (intact, sore)  Hold (Positioning): Assistance needed to correctly position infant at breast and maintain latch.  LATCH Score: 6   Lactation Tools Discussed/Used Tools: Pump Breast pump type: Manual (provided for mom for home use)  Interventions Interventions: Breast feeding basics reviewed;Adjust position;Assisted with latch;Support pillows;Skin to skin;Position options;Breast massage;Hand express;Breast compression  Discharge Pump: Manual WIC Program: Yes  Consult Status Consult Status: Follow-up Date: 10/25/20 Follow-up type: In-patient    [name], Mary Nickel 10/25/2020, 1:28 AM

## 2020-10-26 ENCOUNTER — Other Ambulatory Visit (HOSPITAL_COMMUNITY): Payer: Self-pay | Admitting: Family Medicine

## 2020-10-26 MED ORDER — AMLODIPINE BESYLATE 10 MG PO TABS: 10.0000 mg | ORAL_TABLET | Freq: Every day | ORAL | 0 refills | Status: DC

## 2020-10-26 MED ORDER — ACETAMINOPHEN 500 MG PO TABS
1000.0000 mg | ORAL_TABLET | Freq: Once | ORAL | Status: AC
  Administered 2020-10-26: 1000 mg via ORAL
  Filled 2020-10-26: qty 2

## 2020-10-26 MED ORDER — ACETAMINOPHEN 325 MG PO TABS: 650.0000 mg | ORAL_TABLET | ORAL | 0 refills | Status: DC | PRN

## 2020-10-26 MED ORDER — IBUPROFEN 600 MG PO TABS
600.0000 mg | ORAL_TABLET | Freq: Four times a day (QID) | ORAL | 0 refills | Status: DC | PRN
Start: 2020-10-26 — End: 2022-04-05

## 2020-10-26 MED ORDER — AMLODIPINE BESYLATE 5 MG PO TABS
10.0000 mg | ORAL_TABLET | Freq: Every day | ORAL | Status: DC
  Administered 2020-10-26: 10 mg via ORAL
  Filled 2020-10-26: qty 2

## 2020-10-26 MED ORDER — SERTRALINE HCL 25 MG PO TABS: 25.0000 mg | ORAL_TABLET | Freq: Every day | ORAL | 0 refills | Status: DC

## 2020-10-26 MED ORDER — COCONUT OIL OIL: 1.0000 "application " | TOPICAL_OIL | 0 refills | Status: DC | PRN

## 2020-10-26 MED ORDER — AMLODIPINE BESYLATE 5 MG PO TABS: 5.0000 mg | ORAL_TABLET | Freq: Every day | ORAL | 0 refills | Status: DC

## 2020-10-26 MED FILL — AMLODIPINE BESYLATE 10 MG T: 10 | 30 days supply | Qty: 30 | Fill #0

## 2020-10-26 MED FILL — SERTRALINE HCL 25 MG TABS: 25 | 30 days supply | Qty: 30 | Fill #0

## 2020-10-26 NOTE — Social Work (Addendum)
10:30a CSW contacted [facility] to inquire on getting [name] a bassinet for baby upon discharge. CSW informed that the [place] office is closed, but will be able to follow-up with [name] on Monday to provide with a bassinet. CSW inquired on [name] getting a bassinet from the [place] office and was informed it would best for the [place] office to follow-up considering that is where [name] is already connected.  CSW spoke with [name] to provide the update. [name] stated she has already reached out to her [place] contact and they have not yet responded. [name] stated she received other items from the [place] office and considering this she will contact them directly to ask about getting a bassinet.   Update: [name] spoke with [facility] [place] and will pick-up items for baby upon discharge.   [name] has appropriate resources to obtain appropriate sleep space for baby. CSW identifies no additional need for intervention and no barriers to discharge at this time.   Mary [name], LCSWA Clinical Social Work [facility] [phone] 

## 2020-10-26 NOTE — Progress Notes (Signed)
Pt called out for nurse.  Pt lying in bed c/o Chest Pain that feels like "burning that started about 15 minutes ago".  Pt states she has never had this pain in the past.  Dr. Mart Piggs at bedside who saw that vitals were stable and verbalized increasing Amlodipine to 10mg /day and also one time order of 1,000mg  Tylenol for pain.    When RN back with meds, pt stated her pain was "easing up a little bit".  Meds given, Peds MD at bedside assessing infant.  Will continue to monitor.

## 2020-10-26 NOTE — Lactation Note (Signed)
This note was copied from a baby's chart. Lactation Consultation Note  Patient Name: Mary [name] [id number] Date: 10/26/2020 Reason for consult: Follow-up assessment Age:26 hours   P4 mother whose infant is now 82 hours old.  This is a term baby at 40+4 weeks.  Mother's feeding preference is breast/bottle.  Baby was asleep STS on mother's chest when I arrived.  Mother has been breast feeding and supplementing with [employer] GoodStart formula.  Baby has adequate voids/stools.  Mother will continue to feed on cue or at least 8-12 times/24 hours.  She will supplement as desired.  Engorgement prevention/treatment reviewed.  Mother has a manual pump for home use.  Provided extra #24 flange size per mother's request for home DEBP.  Mother awaiting her ride but has been discharged by the pediatrician.     Maternal Data    Feeding    LATCH Score                    Lactation Tools Discussed/Used    Interventions    Discharge    Consult Status Consult Status: Complete Date: 10/26/20 Follow-up type: Call as needed    Sadik Piascik R Darron Stuck 10/26/2020, 11:45 AM

## 2020-10-31 ENCOUNTER — Other Ambulatory Visit: Payer: Self-pay

## 2020-10-31 ENCOUNTER — Ambulatory Visit (INDEPENDENT_AMBULATORY_CARE_PROVIDER_SITE_OTHER): Payer: Medicaid Other | Admitting: Obstetrics and Gynecology

## 2020-10-31 VITALS — BP 118/74

## 2020-10-31 DIAGNOSIS — Z0130 Encounter for examination of blood pressure without abnormal findings: Secondary | ICD-10-CM

## 2020-10-31 DIAGNOSIS — F418 Other specified anxiety disorders: Secondary | ICD-10-CM

## 2020-10-31 DIAGNOSIS — O99345 Other mental disorders complicating the puerperium: Secondary | ICD-10-CM

## 2020-10-31 MED ORDER — ABDOMINAL BINDER/ELASTIC LARGE MISC: 1.0000 | Freq: Every day | 0 refills | Status: DC

## 2020-10-31 MED ORDER — HYDROXYZINE PAMOATE 25 MG PO CAPS: 25.0000 mg | ORAL_CAPSULE | Freq: Three times a day (TID) | ORAL | 0 refills | Status: DC | PRN

## 2020-10-31 MED ORDER — SERTRALINE HCL 50 MG PO TABS: 75.0000 mg | ORAL_TABLET | Freq: Every day | ORAL | 1 refills | Status: DC

## 2020-10-31 NOTE — Progress Notes (Signed)
Pt is here for BP check one week after delivery. Pt is currently taking 10mg  Norvasc.BP 118/74. Pt states sometimes she feels dizzy. Pt is concerned about increase in anxiety. Currently taking 25mg  of Zoloft. Scored 10 on screening today. Pt states her mother is causing extra stress in her life and it is hard for her to take care of her children and her mother.

## 2020-10-31 NOTE — Progress Notes (Signed)
[name] is a 25 y.o. female [phone] Status post vaginal delivery on 2/2 here for a BP check. She had elevated BP/ GHTN and was started on Norvasc 10 mg at the time of discharge.   Vitals:   10/31/20 1017  BP: 118/74    GENERAL: Well-developed, well-nourished female in no acute distress.  LUNGS: Effort normal SKIN: Warm, dry and without erythema PSYCH: Normal mood and affect  I was asked by the nursing staff to meet with the patient today do to the patient reporting anxiety. She reports that since she has been home her mother has been posting negative things about her on social media. She does not live with mom, however reports an unhealthy relationship. She feels safe in her living environment, and has no thoughts of harm to her self or her family/ baby. She is currently taking Zoloft 25 mg daily.   A:   Postpartum anxiety BP check  P:  Continue Norvasc 10 mg daily Increase Zoloft to 75 mg daily  Keep your appointment with integrated health on 2/15 Call the office if anxiety worsens Keep PP visit for IUD placement  Garvey Westcott, Harolyn Rutherford, NP 11/02/2020 8:48 AM

## 2020-11-01 NOTE — BH Specialist Note (Signed)
Integrated Behavioral Health via Telemedicine Visit  11/01/2020 Mary [name] [id number]  Number of Integrated Behavioral Health visits: 4 Session Start time: 9:35  Session End time: 9:53 Total time: 15  Referring Provider: Venia Carbon, NP Patient/Family location: Home [facility] Provider location: Center for [facility] Healthcare at [facility]  All persons participating in visit: [name] and [facility] Mary [name]   Types of Service: Telephone visit  I connected with Mary [name] and/or [employer] n/a by Telephone  (Video is Surveyor, mining) and verified that I am speaking with the correct person using two identifiers.Discussed confidentiality: Yes   I discussed the limitations of telemedicine and the availability of in person appointments.  Discussed there is a possibility of technology failure and discussed alternative modes of communication if that failure occurs.  I discussed that engaging in this telemedicine visit, they consent to the provision of behavioral healthcare and the services will be billed under their insurance.  Patient and/or legal guardian expressed understanding and consented to Telemedicine visit: Yes   Presenting Concerns: Patient and/or family reports the following symptoms/concerns: [name], is managing well taking Zoloft at night, sleeping when baby sleeps, eating well, and has good support at home.  Duration of problem: Increased fatigue postpartum; Severity of problem: moderate  Patient and/or Family's Strengths/Protective Factors: Social connections, Concrete supports in place (healthy food, safe environments, etc.) and Sense of purpose  Goals Addressed: Patient will: 1.  Reduce symptoms of: anxiety, depression and stress  2.  Increase knowledge and/or ability of: healthy habits  3.  Demonstrate ability to: Increase healthy adjustment to current life circumstances  and Increase adequate support systems for patient/family  Progress towards Goals: Ongoing  Interventions: Interventions utilized:  Solution-Focused Strategies, Psychoeducation and/or Health Education and Link to [facility] Standardized Assessments completed: GAD-7 and PHQ 9  Patient and/or Family Response: Pt agrees to treatment plan  Assessment: Patient currently experiencing Adjustment disorder with mixed anxiety and depressed mood.   Patient may benefit from continued psychoeducation and brief therapeutic interventions regarding coping with symptoms of anxiety, depression, stress   Plan: 1. Follow up with behavioral health clinician on : Two weeks 2. Behavioral recommendations:  -Continue prioritizing healthy sleeping and eating daily until at least postpartum medical visit -Consider registering for and attending at least one new mom support group at either [email or web address] or www.[email or web address].net for additional support 3. Referral(s): Integrated [employer] (In Clinic)  I discussed the assessment and treatment plan with the patient and/or parent/guardian. They were provided an opportunity to ask questions and all were answered. They agreed with the plan and demonstrated an understanding of the instructions.   They were advised to call back or seek an in-person evaluation if the symptoms worsen or if the condition fails to improve as anticipated.  Rae Lips, LCSW   Depression screen [facility] 2/9 11/06/2020 10/01/2020 08/27/2020  Decreased Interest 0 1 2  Down, Depressed, Hopeless 1 1 1   PHQ - 2 Score 1 2 3   Altered sleeping 0 3 3  Tired, decreased energy 3 3 3   Change in appetite 1 1 3   Feeling bad or failure about yourself  1 1 1   Trouble concentrating 0 1 1  Moving slowly or fidgety/restless 0 2 1  Suicidal thoughts 0 0 0  PHQ-9 Score 6 13 15    GAD 7 : Generalized Anxiety Score 11/06/2020 10/01/2020 08/27/2020  Nervous, Anxious, on Edge 1 1 3    Control/stop  worrying 1 1 3   Worry too much - different things 1 1 3   Trouble relaxing 1 2 3   Restless 0 0 1  Easily annoyed or irritable 1 2 3   Afraid - awful might happen 0 1 1  Total GAD 7 Score 5 8 17

## 2020-11-02 DIAGNOSIS — F418 Other specified anxiety disorders: Secondary | ICD-10-CM | POA: Insufficient documentation

## 2020-11-02 DIAGNOSIS — Z0130 Encounter for examination of blood pressure without abnormal findings: Secondary | ICD-10-CM | POA: Insufficient documentation

## 2020-11-06 ENCOUNTER — Ambulatory Visit (INDEPENDENT_AMBULATORY_CARE_PROVIDER_SITE_OTHER): Payer: Medicaid Other | Admitting: Clinical

## 2020-11-06 ENCOUNTER — Other Ambulatory Visit: Payer: Self-pay

## 2020-11-06 DIAGNOSIS — F4323 Adjustment disorder with mixed anxiety and depressed mood: Secondary | ICD-10-CM

## 2020-11-06 NOTE — BH Specialist Note (Signed)
Error; pt cancelled visit via automated system

## 2020-11-06 NOTE — Patient Instructions (Signed)
Center for [facility] Healthcare at [facility] for Women [address] [place], [place] [address] [phone] (main office) [phone] (Saskia Simerson's office)  New mom online support:  [email or web address]  Www.[email or web address].net

## 2020-11-20 ENCOUNTER — Ambulatory Visit: Payer: Medicaid Other | Admitting: Clinical

## 2020-11-23 ENCOUNTER — Ambulatory Visit: Payer: Medicaid Other | Admitting: Obstetrics and Gynecology

## 2020-11-27 ENCOUNTER — Other Ambulatory Visit (HOSPITAL_COMMUNITY)
Admission: RE | Admit: 2020-11-27 | Discharge: 2020-11-27 | Disposition: A | Discharge status: Home / Self Care | Payer: Medicaid Other | Source: Ambulatory Visit | Attending: Advanced Practice Midwife | Admitting: Advanced Practice Midwife

## 2020-11-27 ENCOUNTER — Other Ambulatory Visit: Payer: Self-pay

## 2020-11-27 ENCOUNTER — Ambulatory Visit (INDEPENDENT_AMBULATORY_CARE_PROVIDER_SITE_OTHER): Payer: Medicaid Other | Admitting: Advanced Practice Midwife

## 2020-11-27 DIAGNOSIS — F69 Unspecified disorder of adult personality and behavior: Secondary | ICD-10-CM | POA: Diagnosis not present

## 2020-11-27 DIAGNOSIS — Z8759 Personal history of other complications of pregnancy, childbirth and the puerperium: Secondary | ICD-10-CM

## 2020-11-27 DIAGNOSIS — A6000 Herpesviral infection of urogenital system, unspecified: Secondary | ICD-10-CM

## 2020-11-27 DIAGNOSIS — Z3009 Encounter for other general counseling and advice on contraception: Secondary | ICD-10-CM | POA: Insufficient documentation

## 2020-11-27 DIAGNOSIS — N393 Stress incontinence (female) (male): Secondary | ICD-10-CM

## 2020-11-27 DIAGNOSIS — Z30430 Encounter for insertion of intrauterine contraceptive device: Secondary | ICD-10-CM

## 2020-11-27 DIAGNOSIS — N760 Acute vaginitis: Secondary | ICD-10-CM

## 2020-11-27 DIAGNOSIS — R87810 Cervical high risk human papillomavirus (HPV) DNA test positive: Secondary | ICD-10-CM

## 2020-11-27 DIAGNOSIS — Z975 Presence of (intrauterine) contraceptive device: Secondary | ICD-10-CM | POA: Insufficient documentation

## 2020-11-27 DIAGNOSIS — B3731 Acute candidiasis of vulva and vagina: Secondary | ICD-10-CM

## 2020-11-27 DIAGNOSIS — B373 Candidiasis of vulva and vagina: Secondary | ICD-10-CM

## 2020-11-27 DIAGNOSIS — B9689 Other specified bacterial agents as the cause of diseases classified elsewhere: Secondary | ICD-10-CM

## 2020-11-27 DIAGNOSIS — R87610 Atypical squamous cells of undetermined significance on cytologic smear of cervix (ASC-US): Secondary | ICD-10-CM

## 2020-11-27 HISTORY — DX: Personal history of other complications of pregnancy, childbirth and the puerperium: Z87.59

## 2020-11-27 HISTORY — DX: Herpesviral infection of urogenital system, unspecified: A60.00

## 2020-11-27 LAB — POCT URINE PREGNANCY: Preg Test, Ur: NEGATIVE

## 2020-11-27 MED ORDER — VALACYCLOVIR HCL 500 MG PO TABS: 500.0000 mg | ORAL_TABLET | Freq: Every day | ORAL | 12 refills | Status: DC

## 2020-11-27 MED ORDER — PARAGARD INTRAUTERINE COPPER IU IUD: INTRAUTERINE_SYSTEM | Freq: Once | INTRAUTERINE | Status: AC

## 2020-11-27 NOTE — Progress Notes (Signed)
Post Partum Visit Note  Mary [name] is a 25 y.o. [phone] female who presents for a postpartum visit. She is 5 weeks postpartum following a normal spontaneous vaginal delivery.  I have fully reviewed the prenatal and intrapartum course. The delivery was at 40 gestational weeks.  Anesthesia: none. Postpartum course has been unremarkable. Baby is doing well. Baby is feeding by both breast and bottle - Gerber Gentle. Bleeding no bleeding. Bowel function is normal. Bladder function is normal. Patient is sexually active. Contraception method is IUD (pt has had unprotected intercourse without protection multiple times in the past two weeks) . Postpartum depression screening: negative (score 6) .   The pregnancy intention screening data noted above was reviewed. Potential methods of contraception were discussed. The patient elected to proceed with IUD or IUS.    Edinburgh Postnatal Depression Scale - 11/27/20 1332      Edinburgh Postnatal Depression Scale:  In the Past 7 Days   I have been able to laugh and see the funny side of things. 0    I have looked forward with enjoyment to things. 0    I have blamed myself unnecessarily when things went wrong. 0    I have been anxious or worried for no good reason. 2    I have felt scared or panicky for no good reason. 2    Things have been getting on top of me. 2    I have been so unhappy that I have had difficulty sleeping. 0    I have felt sad or miserable. 0    I have been so unhappy that I have been crying. 0    The thought of harming myself has occurred to me. 0    Edinburgh Postnatal Depression Scale Total 6            The following portions of the patient's history were reviewed and updated as appropriate: allergies, current medications, past family history, past medical history, past social history, past surgical history and problem list.  Review of Systems Pertinent items noted in HPI and remainder of comprehensive ROS otherwise  negative.    Objective:  BP 124/78   Wt 214 lb (97.1 kg)   LMP 01/14/2020   BMI 35.61 kg/m    VS reviewed, nursing note reviewed,  Constitutional: well developed, well nourished, no distress HEENT: normocephalic CV: normal rate Pulm/chest wall: normal effort Breast Exam:  Deferred  Abdomen: soft Neuro: alert and oriented x 3 Skin: warm, dry Psych: affect normal Pelvic exam: Cervix pink, visually closed, without lesion, small amount thick yellow discharge, vaginal walls and external genitalia normal   IUD Procedure Note Patient identified, informed consent performed.  Discussed risks of irregular bleeding, cramping, infection, malpositioning or misplacement of the IUD outside the uterus which may require further procedures. Time out was performed.  Urine pregnancy test negative.  Speculum placed in the vagina.  Cervix visualized.  Cleaned with Betadine x 2.  Grasped anteriorly with a single tooth tenaculum.  Uterus sounded to 8 cm  Paragard IUD placed per manufacturer's recommendations.  Strings trimmed to 3 cm. Tenaculum was removed, good hemostasis noted.  Patient tolerated procedure well.   Patient was given post-procedure instructions and the Paragard care card with expiration date.  Patient was also asked to check IUD strings periodically and follow up in 4-6 weeks for IUD check.  Assessment:   Normal postpartum exam. Pap smear not done at today's visit.   1. Postpartum care following vaginal  delivery --Doing well, bonding well with infant.  2. Encounter for counseling regarding contraception --Pt with recent unprotected intercourse. UPT negative today.  Discussed low chance of pregnancy at 5 weeks PP, with some breastfeeding, and without return of menses at this time.  Discussed risks of IUD insertion to early pregnancy, including pregnancy loss and pregnancy complications like miscarriage, PPROM or PTL.  Pt desires IUD today given low chances of pregnancy --She had  Paragard and Mirena in the past and had mood changes with Mirena.  Her former partner did not like Paragard but she was happy with it so decided on Paragard today. - POCT urine pregnancy - Cervicovaginal ancillary only( [place])  3. Problem with behavior of partner --Pt reports he is a problem drinker and is currently on probation. She feels like she has addressed any problems/addictions she had in the past and he needs to address his.  She is worried he might hurt himself or someone else with his behavior. --Recommended Al-Anon for support, pt to talk to [facility], integrated behavioral health, as scheduled  4. Stress incontinence in female  - Ambulatory referral to Physical Therapy  5. Recurrent genital herpes  - valACYclovir (VALTREX) 500 MG tablet; Take 1 tablet (500 mg total) by mouth daily. Start at 36 weeks  Dispense: 30 tablet; Refill: 12  6. History of gestational hypertension --On Norvasc, BP wnl today.  Stop Norvasc and BP check in office next week since pt does not have home cuff.  7. ASCUS with positive high risk HPV cervical --Needs Colpo, scheduled with IUD string check  Plan:   Essential components of care per ACOG recommendations:  1.  Mood and well being: Patient with negative depression screening today. Reviewed local resources for support.   [place] --Pt with home situation, s/o has drinking problem, is on probation, needs to seek help.  See above.   2. Infant care and feeding:  -Patient currently breastmilk feeding? Yes  --If breastmilk feeding discussed return to work and pumping. If needed, patient was provided letter for work to allow for every 2-3 hr pumping breaks, and to be granted a private location to express breastmilk and refrigerated area to store breastmilk. Reviewed importance of draining breast regularly to support lactation. -Social determinants of health (SDOH) reviewed in EPIC. No concerns  3. Sexuality, contraception and birth spacing -  Patient does not want a pregnancy in the next year.  Desired family size is 4 children.  - Reviewed forms of contraception in tiered fashion. Patient desired IUD today.     4. Sleep and fatigue -Encouraged family/partner/community support of 4 hrs of uninterrupted sleep to help with mood and fatigue  5. Physical Recovery  - Discussed patients delivery without complications - Patient had no lacerations, perineal healing reviewed. Patient expressed understanding - Patient has urinary incontinence? Yes   -- Patient was referred to pelvic floor PT  - Patient is safe to resume physical and sexual activity, wait 3 days after Paragard insertion  6.  Health Maintenance - Last pap smear done 09/2019 and was abnormal with ASCUS with HPV--Needs Colpo with IUD string check  7. Chronic Disease --Stop Norvasc today, BP check next week --Renewed Valtrex 500 mg daily for suppressive therapy for HSV - PCP follow up  Sharen Counter, CNM Center for [employer], [facility]

## 2020-11-27 NOTE — Patient Instructions (Signed)

## 2020-11-27 NOTE — BH Specialist Note (Signed)
Integrated Behavioral Health via Telemedicine Visit  11/27/2020 [name] [id number]  Number of Integrated Behavioral Health visits: 2 Session Start time: 10:51  Session End time: 11:32 Total time: 31  Referring Provider: Venia Carbon, NP Patient/Family location: Home [facility] Provider location: Center for [facility] Healthcare at [facility] for Women  All persons participating in visit: [name] and [facility] Asjia Berrios   Types of Service: Individual psychotherapy and Video visit  I connected with [name] and/or Devory Renne's n/a via  Telephone or Engineer, civil (consulting)  (Video is Surveyor, mining) and verified that I am speaking with the correct person using two identifiers. Discussed confidentiality: Yes   I discussed the limitations of telemedicine and the availability of in person appointments.  Discussed there is a possibility of technology failure and discussed alternative modes of communication if that failure occurs.  I discussed that engaging in this telemedicine visit, they consent to the provision of behavioral healthcare and the services will be billed under their insurance.  Patient and/or legal guardian expressed understanding and consented to Telemedicine visit: Yes   Presenting Concerns: Patient and/or family reports the following symptoms/concerns: Pt states her primary symptoms are lack of quality sleep and anxiety, attributed to "trying to figure out what I'm going to do" about finding work, finding housing, and family responsibilities. Pt feels she has good support at home, is taking Zoloft as prescribed, and agrees to referral to psychiatry for ongoing [facility] medication management and ongoing therapy.  Duration of problem: Ongoing, increase postpartum; Severity of problem: moderate  Patient and/or Family's Strengths/Protective Factors: Concrete supports in place (healthy food, safe environments, etc.)  Goals  Addressed: Patient will: 1.  Reduce symptoms of: anxiety and stress  2.  Demonstrate ability to: Increase healthy adjustment to current life circumstances  Progress towards Goals: Ongoing  Interventions: Interventions utilized:  Medication Monitoring and Sleep Hygiene Standardized Assessments completed: GAD-7 and PHQ 9  Patient and/or Family Response: Pt agrees to revised treatment plan  Assessment: Patient currently experiencing Mood disorder, unspecified.   Patient may benefit from continued psychoeducation and brief therapeutic interventions regarding coping with symptoms of anxiety and depression .  Plan: 1. Follow up with behavioral health clinician on : Call Saifan Rayford at [phone] as needed 2. Behavioral recommendations:  -Accept referral to psychiatry -Continue taking Zoloft as prescribed -Consider additional housing resources (listed on After Visit Summary) 3. Referral(s): Integrated [employer] (In Clinic) and [facility]:  Housing  I discussed the assessment and treatment plan with the patient and/or parent/guardian. They were provided an opportunity to ask questions and all were answered. They agreed with the plan and demonstrated an understanding of the instructions.   They were advised to call back or seek an in-person evaluation if the symptoms worsen or if the condition fails to improve as anticipated.  Rae Lips, LCSW   Depression screen [facility] 2/9 12/06/2020 11/06/2020 10/01/2020 08/27/2020  Decreased Interest 0 0 1 2  Down, Depressed, Hopeless 1 1 1 1   PHQ - 2 Score 1 1 2 3   Altered sleeping 3 0 3 3  Tired, decreased energy 1 3 3 3   Change in appetite 1 1 1 3   Feeling bad or failure about yourself  1 1 1 1   Trouble concentrating 1 0 1 1  Moving slowly or fidgety/restless 0 0 2 1  Suicidal thoughts 0 0 0 0  PHQ-9 Score 8 6 13 15    GAD 7 : Generalized Anxiety Score 12/06/2020  11/06/2020 10/01/2020 08/27/2020  Nervous, Anxious, on Edge  3 1 1 3   Control/stop worrying 1 1 1 3   Worry too much - different things 1 1 1 3   Trouble relaxing 1 1 2 3   Restless 1 0 0 1  Easily annoyed or irritable 1 1 2 3   Afraid - awful might happen 1 0 1 1  Total GAD 7 Score 9 5 8  17

## 2020-11-28 LAB — CERVICOVAGINAL ANCILLARY ONLY
Bacterial Vaginitis (gardnerella): POSITIVE — AB
Candida Glabrata: NEGATIVE
Candida Vaginitis: POSITIVE — AB
Chlamydia: NEGATIVE
Comment: NEGATIVE
Comment: NEGATIVE
Comment: NEGATIVE
Comment: NEGATIVE
Comment: NEGATIVE
Comment: NORMAL
Neisseria Gonorrhea: NEGATIVE
Trichomonas: NEGATIVE

## 2020-12-01 MED ORDER — FLUCONAZOLE 150 MG PO TABS
150.0000 mg | ORAL_TABLET | Freq: Once | ORAL | 0 refills | Status: AC
Start: 2020-12-01 — End: 2020-12-01

## 2020-12-01 MED ORDER — METRONIDAZOLE 500 MG PO TABS: 500.0000 mg | ORAL_TABLET | Freq: Two times a day (BID) | ORAL | 0 refills | Status: AC

## 2020-12-01 NOTE — Addendum Note (Signed)
Addended by: Sharen Counter A on: 12/01/2020 01:48 AM   Modules accepted: Orders

## 2020-12-03 ENCOUNTER — Other Ambulatory Visit: Payer: Self-pay

## 2020-12-03 DIAGNOSIS — B379 Candidiasis, unspecified: Secondary | ICD-10-CM

## 2020-12-03 DIAGNOSIS — B9689 Other specified bacterial agents as the cause of diseases classified elsewhere: Secondary | ICD-10-CM

## 2020-12-03 MED ORDER — FLUCONAZOLE 150 MG PO TABS: 150.0000 mg | ORAL_TABLET | Freq: Once | ORAL | 0 refills | Status: AC

## 2020-12-03 NOTE — Progress Notes (Signed)
Pt informed of positive BV and yeast. Flagyl sent to pharmacy by Leveda Anna, CNM. I sent Diflucan to pharmacy per [facility], CNM.

## 2020-12-06 ENCOUNTER — Ambulatory Visit (INDEPENDENT_AMBULATORY_CARE_PROVIDER_SITE_OTHER): Payer: Medicaid Other | Admitting: Clinical

## 2020-12-06 DIAGNOSIS — F39 Unspecified mood [affective] disorder: Secondary | ICD-10-CM

## 2020-12-06 NOTE — Patient Instructions (Signed)
Center for [facility] at [facility] for [facility] [place], [place] [address] [phone] (main office) [phone] [facility] office)  [place] (serves [place], [place], [place], [place], [place], [place], [place], [place], [place], [place], [place], [place], and [place] counties) [address] [place], [place] [address] [phone] http://dawson-[email or web address]  **Rental assistance, Home Rehabilitation,Weatherization Assistance Program, Forensic psychologist, Housing Voucher Program  [employer] for Housing and [employer] Studies: Chief Financial Officer Resources to residents of [place], [place], and [place]. Make sure you have your documents ready, including:  Marland Kitchen (Household income verification: 2 months pay stubs, unemployment/social security award letter, statement of no income for all household members over 52) . Photo ID for all household members over 41 . Utility Bill/Rent Ledger/Lease: must show past due amount for utilities/rent, or the rental agreement if rent is current 2. Start your application online or by paper (in [place] or [place]) at:     http://boyd-evans.org/  3. Once you have completed the online application, you will get an email confirmation message from the county. Expect to hear back by phone or email at least 6-10 weeks from submitting your application.  4. While you wait:  . Call [phone] to check in on your application . Let your landlord know that you've applied. Your landlord will be asked to submit documents (W-9) during this application process. Payments will be made directly to the landlord/property [place] or utility assistance for [employer], [place], and [place] . Apply at https://rb.gy/dvxbfv . Questions? Call or  email Renee at [phone] or [email or web address]   Eviction Mediation Program: The HOPE Program Https://www.rebuild.http://mills-williams.net/ HOPE Progam serves low-income renters in [place] counties, defined as less than or equal to 80% of the area median income for the county where the renter lives. In the following 12 counties, you should apply to your local rent and utility assistance program INSTEAD OF the HOPE Program: [place], [place], [place], [place], [place], [place], [place], [place], [place], [place], [place], [place]  . If you live outside of [facility], contact [place] call center at [phone] to talk to a Program Representative Monday-Friday, 8am-5pm . Note that Native American tribes also received federal funding for rent and utility assistance programs. Recognized members of the following tribes will be served by programs managed by tribal governments, including: [place], [place], [place], [place] of [place] and [place] . Eviction Mediation Coordinator, Renee, [phone] [email or web address] . Housing Resources Navigator, [place], [phone] [email or web address] .[place] [address], [place], [place] [address] [phone] www.gha-[place].[facility] [address] [place], [place] [address] [phone] https://manning.com/ **Programs include: Engineer, building services and Housing Counseling, Healthy Doctor, general practice, Homeless Prevention and [place] [address] [place], [place], [place] [address] [phone] www.https://www.farmer-stevens.info/ **housing applications/recertification; tax payment relief/exemption under specific qualifications  [facility] [address], [place], [place]  [address] www.onlineg[place].com/~maryshouse **transitional housing for women in recovery who have minor children or are pregnant  [place], [place], [place] [address] ArtistMovie.se  **emergency shelter and support services for families facing homelessness  [place] [address], [place], [place] [address] [phone] www.youthfocus.org **transitional housing to pregnant women;  emergency housing for youth who have run away, are experiencing a family crisis, are victims of abuse or neglect, or are homeless  [facility] [address], [place], [place] [address] [phone] ircgso.org **Drop-in center for people experiencing homelessness; overnight warming center when temperature is 25 degrees or below  [employer] [address] [place], [place], [place] [address] [phone] https://reentrystaffingagency.org/ **help with affordable housing to people experiencing homelessness or unemployment due to incarceration  [facility] [address], [place], [place] [address] [phone] www.greensborourbanministry.org  **emergency and transitional housing, rent/mortgage assistance, utility assistance  [facility] [address], [place], [place] [address] [phone] www.salvationarmyofg[place]boro.org **emergency and transitional housing  [employer] for [employer] [address], [place], [place] [address] [phone] Www.habitatgreensboro.[facility]   [employer] [address], [place], [place] [address] [phone] https://chshousing.org **Home Ownership/Affordable Housing Program and [facility]  [employer] [address], [place], [place] [address] [phone] PaidValue.com.cy **home buyer education courses, foreclosure prevention  [facility] [address], [place], [place] [address] [phone] DefMagazine.is **Environmental Exposure Assessment (investigation of homes where either children or pregnant women with a confirmed elevated blood lead level reside)  [facility] of [facility] [address] [place], [place] [address] [phone] ShowReturn.ca **Home Expense Assistance/Repairs Program; offers home accessibility updates, such as ramps or bars in the bathroom  [facility] [facility] [address] [place], [place] [address] [phone] https://www.self-help.org/locations/[place]-branch **Offers credit-building and banking services to people unable to use traditional banking

## 2020-12-20 ENCOUNTER — Telehealth: Payer: Self-pay | Admitting: Clinical

## 2020-12-20 NOTE — Telephone Encounter (Signed)
Attempt to f/u with pt after referral to psychiatry/ongoing therapy. Unable to leave voice message as voicemail is full; left MyChart message to patient.

## 2020-12-25 ENCOUNTER — Ambulatory Visit: Payer: Medicaid Other | Admitting: Advanced Practice Midwife

## 2020-12-27 ENCOUNTER — Other Ambulatory Visit (HOSPITAL_COMMUNITY)
Admission: RE | Admit: 2020-12-27 | Discharge: 2020-12-27 | Disposition: A | Discharge status: Home / Self Care | Payer: Medicaid Other | Source: Ambulatory Visit | Attending: Obstetrics & Gynecology | Admitting: Obstetrics & Gynecology

## 2020-12-27 ENCOUNTER — Other Ambulatory Visit: Payer: Self-pay

## 2020-12-27 ENCOUNTER — Ambulatory Visit (INDEPENDENT_AMBULATORY_CARE_PROVIDER_SITE_OTHER): Payer: Medicaid Other | Admitting: Obstetrics & Gynecology

## 2020-12-27 ENCOUNTER — Encounter: Payer: Self-pay | Admitting: Obstetrics & Gynecology

## 2020-12-27 VITALS — BP 99/63 | HR 79 | Resp 16 | Ht 65.0 in | Wt 215.0 lb

## 2020-12-27 DIAGNOSIS — R87610 Atypical squamous cells of undetermined significance on cytologic smear of cervix (ASC-US): Secondary | ICD-10-CM | POA: Diagnosis not present

## 2020-12-27 DIAGNOSIS — Z975 Presence of (intrauterine) contraceptive device: Secondary | ICD-10-CM

## 2020-12-27 DIAGNOSIS — R87810 Cervical high risk human papillomavirus (HPV) DNA test positive: Secondary | ICD-10-CM | POA: Diagnosis present

## 2020-12-27 DIAGNOSIS — Z30431 Encounter for routine checking of intrauterine contraceptive device: Secondary | ICD-10-CM

## 2020-12-27 DIAGNOSIS — F419 Anxiety disorder, unspecified: Secondary | ICD-10-CM | POA: Insufficient documentation

## 2020-12-27 LAB — POCT URINE PREGNANCY: Preg Test, Ur: NEGATIVE

## 2020-12-27 NOTE — Progress Notes (Signed)
    GYNECOLOGY OFFICE COLPOSCOPY PROCEDURE NOTE  25 y.o. [id number] here for colposcopy for ASCUS with POSITIVE high risk HPV pap smear on 10/07/2019. Discussed role for HPV in cervical dysplasia, need for surveillance. Patient is also here for IUD check, had Paragard placed 11/27/2020. Reports no significant issues  Patient gave informed written consent, time out was performed.  Placed in lithotomy position. Cervix viewed with speculum, Paragard IUD strings seen.  Pap smear sample obtained. Cervix was then viewed with colposcope after application of acetic acid.   Colposcopy adequate? Yes Acetowhite lesion noted at 12 o'clock; corresponding biopsy obtained.  ECC specimen obtained.  All specimens were labeled and sent to pathology.  Patient was given post procedure instructions.  Will follow up pathology and manage accordingly; patient will be contacted with results and recommendations.   Normal IUD check, no issues, can keep Paragard in for up to ten years. Routine preventative health maintenance measures emphasized.    Jaynie Collins, MD, FACOG Obstetrician & Gynecologist, [facility] for [employer], [facility]

## 2020-12-27 NOTE — Patient Instructions (Signed)
COLPOSCOPY POST-PROCEDURE INSTRUCTIONS  1. You may take Ibuprofen, Aleve or Tylenol for cramping if needed.  2. If Monsel's solution was used, you will have a black discharge.  3. Light bleeding is normal.  If bleeding is heavier than your period, please call.  4. Put nothing in your vagina until the bleeding or discharge stops (usually 2 or 3 days).  5. We will call you within one week with biopsy results or discuss the results at your follow-up appointment if needed.  

## 2020-12-31 ENCOUNTER — Telehealth: Payer: Self-pay | Admitting: *Deleted

## 2020-12-31 LAB — SURGICAL PATHOLOGY

## 2020-12-31 NOTE — Telephone Encounter (Signed)
Sent patient a my chart message that her colposcopy biopsy showed LGSIL and she will only need to have a repeat pap in 1 year per Dr Macon Large.

## 2021-01-01 LAB — CYTOLOGY - PAP
Comment: NEGATIVE
Diagnosis: NEGATIVE
High risk HPV: NEGATIVE

## 2021-01-28 ENCOUNTER — Encounter: Payer: Self-pay | Admitting: Physical Therapy

## 2021-01-28 ENCOUNTER — Ambulatory Visit: Payer: Medicaid Other | Attending: Advanced Practice Midwife | Admitting: Physical Therapy

## 2021-01-28 ENCOUNTER — Other Ambulatory Visit: Payer: Self-pay

## 2021-01-28 DIAGNOSIS — R279 Unspecified lack of coordination: Secondary | ICD-10-CM | POA: Insufficient documentation

## 2021-01-28 DIAGNOSIS — M545 Low back pain, unspecified: Secondary | ICD-10-CM | POA: Diagnosis present

## 2021-01-28 DIAGNOSIS — G8929 Other chronic pain: Secondary | ICD-10-CM | POA: Diagnosis present

## 2021-01-28 DIAGNOSIS — M6281 Muscle weakness (generalized): Secondary | ICD-10-CM | POA: Diagnosis not present

## 2021-01-28 NOTE — Therapy (Signed)
[facility] [address] [place], [place], [address] Phone: [phone]   Fax:  [phone]  Physical Therapy Evaluation  Patient Details  Name: Mary [name] MRN: [id number] Date of Birth: 12-07-1995 Referring Provider (PT): Hurshel Party, CNM   Encounter Date: 01/28/2021   PT End of Session - 01/28/21 1107    Visit Number 1    Date for PT Re-Evaluation 04/22/21    Authorization Type CCME medicaid    PT Start Time 1017    PT Stop Time 1054    PT Time Calculation (min) 37 min    Activity Tolerance Patient tolerated treatment well    Behavior During Therapy [facility] for tasks assessed/performed           Past Medical History:  Diagnosis Date  . Anxiety   . Asthma    as a child  . Chlamydia   . Depression   . History of gestational hypertension 11/27/2020  . Recurrent genital herpes 11/27/2020  . Trichomonas     Past Surgical History:  Procedure Laterality Date  . NO PAST SURGERIES      There were no vitals filed for this visit.    Subjective Assessment - 01/28/21 1021    Subjective Pt states she has had back pain that has been getting worse and making it hard to exercise.  Pt states leakage happens at least 1x/day and has to change clothes.    Limitations Lifting    Patient Stated Goals back pain managed and be able to carry infant    Currently in Pain? Yes    Pain Score 6     Pain Location Back    Pain Orientation Mid;Lower    Pain Descriptors / Indicators Sharp;Burning    Pain Type Chronic pain    Pain Radiating Towards sometimes into the shoulders    Pain Onset More than a month ago    Aggravating Factors  exercise makes me have to pee; standing/walking, standing in line at the store    Pain Relieving Factors exercise such as walking    Multiple Pain Sites No              [facility] PT Assessment - 01/28/21 0001      Assessment   Medical Diagnosis N39.3 (ICD-10-CM) - Stress incontinence  in female    Referring Provider (PT) Leftwich-Kirby, Wilmer Floor, CNM    Onset Date/Surgical Date --   years ongoing   Prior Therapy No      Precautions   Precautions None      Balance Screen   Has the patient fallen in the past 6 months Yes    How many times? 1   stumbled over object in the room   Has the patient had a decrease in activity level because of a fear of falling?  No    Is the patient reluctant to leave their home because of a fear of falling?  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      Cognition   Overall Cognitive Status Within Functional Limits for tasks assessed      Posture/Postural Control   Posture/Postural Control Postural limitations    Postural Limitations Anterior pelvic tilt;Decreased  lumbar lordosis      ROM / Strength   AROM / PROM / Strength AROM;PROM;Strength      AROM   Overall AROM Comments lumbar flexion 75%      Strength   Overall Strength Comments hip flex and abduction 4/5      Palpation   Palpation comment tight hip flexors and lumbar paraspinals      Ambulation/Gait   Gait Pattern Within Functional Limits                      Objective measurements completed on examination: See above findings.     Pelvic Floor Special Questions - 01/28/21 0001    Are you Pregnant or attempting pregnancy? No    Prior Pregnancies Yes    Number of Pregnancies 4    Number of Vaginal Deliveries 4    Any difficulty with labor and deliveries --   tearing with first   Diastasis Recti No    Currently Sexually Active Yes    Is this Painful Yes    Marinoff Scale discomfort that does not affect completion   dryness   Urinary Leakage Yes    How often 1/day    Pad use no    Activities that cause leaking  Coughing;Sneezing    Urinary urgency Yes    Urinary frequency 8-9    Fluid intake 3 glasses    Falling out feeling (prolapse) No    Skin Integrity Intact    Perineal Body/Introitus  Descended    Pelvic Floor Internal Exam pt identity confirmed and internal assessment performed    Exam Type Vaginal    Palpation tight levators and referred to rectum    Strength good squeeze, good lift, able to hold agaisnt strong resistance    Strength # of reps 1    Strength # of seconds 1    Tone high                         PT Long Term Goals - 01/28/21 1108      PT LONG TERM GOAL #1   Title Pt will be ind with advanced HEP for getting back to regular exercise routine    Baseline does not know    Time 12    Period Weeks    Status New    Target Date 04/22/21      PT LONG TERM GOAL #2   Title Pt will be able to cough and sneeze without leakage or at most 1-2 drops    Baseline at this time, soaks through underwear and pants and needs to change clothes with stress incontinence    Time 12    Period Weeks    Status New    Target Date 04/22/21      PT LONG TERM GOAL #3   Title Pt will report at least 50% less low back pain for improved functional lifting and standing    Baseline 6/10 pain on average, cannot stand up for more than 10-15 minutes    Time 12    Period Weeks    Status New    Target Date 04/22/21      PT LONG TERM GOAL #4   Title Pt will have improved endurance of pelvic floor to at least 10 sec hold for reduced leakage    Baseline 4/5MMT 1 second hold    Time 12    Period Weeks    Status  New    Target Date 04/22/21                  Plan - 01/28/21 1425    Clinical Impression Statement Pt presents to clinic today s/p 4 vaginal deliveries, chronic low back pain and urinary leakage that has been worsening especially since delivery of most recent child.  Pt has abdominal weakness and had a hard time sitting up in the chair during the history taking  needing to lean on the chair for support.  pt has increased anterior tilt in standing and tight hip flexors.  Pt has hip flexion and abduction weakness.  pt has good MMT of pelvic floor at 4/5 but poor endurance of 1 second hold and poor ability to relax muscles after contracting.  Pt cannot do quick flick due to high tone.  She has sensation of fecal urgency with pressure to levators.  Pt also demonstrates high urgency during evaluation and had to quickly ge to the bathroom part way through history/education.  Pt will benefit from skilled PT to work on functional goals for improved self care and care taking of her family as well as pain management.  PT is needed to address impairments and restore maximum level of function.    Personal Factors and Comorbidities Comorbidity 3+    Comorbidities chronic back pain; 4 vaginal deliveries; anxiety, IBS    Examination-Activity Limitations Continence;Lift;Caring for Others;Carry;Toileting;Stand    Examination-Participation Restrictions Cleaning;Community Activity    Stability/Clinical Decision Making Evolving/Moderate complexity    Clinical Decision Making Moderate    Rehab Potential Excellent    PT Frequency 1x / week    PT Duration 12 weeks    PT Treatment/Interventions ADLs/Self Care Home Management;Biofeedback;Cryotherapy;Electrical Stimulation;Moist Heat;Therapeutic exercise;Therapeutic activities;Neuromuscular re-education;Patient/family education;Manual techniques;Passive range of motion;Dry needling;Taping    PT Next Visit Plan urge techniques; massage with foam, sit on ball, breath and relax; basic core strength; toilet techniques    Consulted and Agree with Plan of Care Patient           Patient will benefit from skilled therapeutic intervention in order to improve the following deficits and impairments:  Decreased endurance,Increased muscle spasms,Pain,Postural dysfunction,Impaired flexibility,Increased fascial restricitons,Decreased  strength,Decreased coordination  Visit Diagnosis: Muscle weakness (generalized)  Unspecified lack of coordination  Chronic low back pain, unspecified back pain laterality, unspecified whether sciatica present     Problem List Patient Active Problem List   Diagnosis Date Noted  . ASCUS with positive high risk HPV cervical on 10/07/2019 12/27/2020  . Anxiety and depression   . Paragard IUD (intrauterine device) in place since 11/27/2020 11/27/2020    Brayton Caves Wirt Hemmerich, PT 01/28/2021, 2:47 PM  [facility] [address] [place], [place], [address] Phone: [phone]   Fax:  [phone]  Name: Mary [name] MRN: [id number] Date of Birth: 1996/01/31

## 2021-02-08 ENCOUNTER — Telehealth: Payer: Self-pay | Admitting: Physical Therapy

## 2021-02-08 ENCOUNTER — Ambulatory Visit: Payer: Medicaid Other | Admitting: Physical Therapy

## 2021-02-08 NOTE — Telephone Encounter (Signed)
Patient did not show for appointment.  Patient was called and PT left message to please call us back. Message also reminded pt of no show policy.  Russella Dar, PT 02/08/21 11:55 AM

## 2021-02-11 ENCOUNTER — Encounter: Payer: Self-pay | Admitting: Physical Therapy

## 2021-02-11 ENCOUNTER — Other Ambulatory Visit: Payer: Self-pay

## 2021-02-11 ENCOUNTER — Ambulatory Visit: Payer: Medicaid Other | Admitting: Physical Therapy

## 2021-02-11 DIAGNOSIS — M6281 Muscle weakness (generalized): Secondary | ICD-10-CM | POA: Diagnosis not present

## 2021-02-11 DIAGNOSIS — R279 Unspecified lack of coordination: Secondary | ICD-10-CM

## 2021-02-11 DIAGNOSIS — M545 Low back pain, unspecified: Secondary | ICD-10-CM

## 2021-02-11 DIAGNOSIS — G8929 Other chronic pain: Secondary | ICD-10-CM

## 2021-02-11 NOTE — Therapy (Signed)
[facility] [address] [place], [place], [address] Phone: [phone]   Fax:  [phone]  Physical Therapy Treatment  Patient Details  Name: Mary [name] MRN: [id number] Date of Birth: [birth year] Referring Provider (PT): Hurshel Party, CNM   Encounter Date: 02/11/2021   PT End of Session - 02/11/21 1620    Visit Number 2    Date for PT Re-Evaluation 04/22/21    Authorization Type CCME medicaid - 3 until 02/28/21    PT Start Time 1619    PT Stop Time 1658    PT Time Calculation (min) 39 min    Activity Tolerance Patient tolerated treatment well    Behavior During Therapy [facility] for tasks assessed/performed           Past Medical History:  Diagnosis Date  . Anxiety   . Asthma    as a child  . Chlamydia   . Depression   . History of gestational hypertension 11/27/2020  . Recurrent genital herpes 11/27/2020  . Trichomonas     Past Surgical History:  Procedure Laterality Date  . NO PAST SURGERIES      There were no vitals filed for this visit.   Subjective Assessment - 02/11/21 1627    Subjective Pt states she doesn't drink enough water and goes to the bathroom a lot.    Patient Stated Goals back pain managed and be able to carry infant    Pain Score 5     Pain Location Back    Pain Orientation Mid    Pain Descriptors / Indicators Burning    Pain Onset More than a month ago    Multiple Pain Sites No                             [facility] Adult PT Treatment/Exercise - 02/11/21 0001      Neuro Re-ed    Neuro Re-ed Details  breathing with diaphragm      Exercises   Exercises Lumbar      Lumbar Exercises: Stretches   Active Hamstring Stretch Right;Left;1 rep;60 seconds    Lower Trunk Rotation 5 reps;10 seconds    Figure 4 Stretch 1 rep;Supine;60 seconds;With overpressure      Lumbar Exercises: Supine   Ab Set 20 reps    AB Set Limitations press with hands on thighs worked  after several other cues were attempted      Manual Therapy   Manual Therapy Myofascial release    Myofascial Release Rt and Lt lower abdomen until felt had to have a BM (states she was better after stopping and she did not want to go)                  PT Education - 02/11/21 1702    Education Details urge and toileting; Access Code: [facility]    Person(s) Educated Patient    Methods Explanation;Demonstration;Tactile cues;Verbal cues;Handout    Comprehension Returned demonstration;Verbalized understanding               PT Long Term Goals - 02/11/21 1716      PT LONG TERM GOAL #1   Title Pt will be ind with advanced HEP for getting back to regular exercise routine    Status On-going                 Plan - 02/11/21 1705    Clinical Impression Statement Pt  was given a lot of education for urge and toileting techniques today.  Pt had some urgency to have a BM after STM and then having cramps when doing basic transverse abdominus contraction.  Pt reports she did not want to use the restroom at that time due to it takes her a long time to go.  Pt did not meet goals today due to initial visit since eval. pt will benefit from skilled PT to conitnue to address core and pelvic floor coordination and strength.    PT Treatment/Interventions ADLs/Self Care Home Management;Biofeedback;Cryotherapy;Electrical Stimulation;Moist Heat;Therapeutic exercise;Therapeutic activities;Neuromuscular re-education;Patient/family education;Manual techniques;Passive range of motion;Dry needling;Taping    PT Next Visit Plan sit on ball for relax; STM to adductors and pelvic floor externally if tolerated; f/on toilet and urge    PT Home Exercise Plan Access Code: [email or web address]    Consulted and Agree with Plan of Care Patient           Patient will benefit from skilled therapeutic intervention in order to improve the following deficits and impairments:  Decreased endurance,Increased muscle  spasms,Pain,Postural dysfunction,Impaired flexibility,Increased fascial restricitons,Decreased strength,Decreased coordination  Visit Diagnosis: Muscle weakness (generalized)  Unspecified lack of coordination  Chronic low back pain, unspecified back pain laterality, unspecified whether sciatica present     Problem List Patient Active Problem List   Diagnosis Date Noted  . ASCUS with positive high risk HPV cervical on 10/07/2019 12/27/2020  . Anxiety and depression   . Paragard IUD (intrauterine device) in place since 11/27/2020 11/27/2020    Mary [name], PT 02/11/2021, 5:19 PM  [facility] [address] [place], [place], [address] Phone: [phone]   Fax:  [phone]  Name: Mary [name] MRN: [id number] Date of Birth: 06/20/1996

## 2021-02-11 NOTE — Patient Instructions (Addendum)
Toileting Techniques for Bowel Movements (Defecation) Using your belly (abdomen) and pelvic floor muscles to have a bowel movement is usually instinctive.  Sometimes people can have problems with these muscles and have to relearn proper defecation (emptying) techniques.  If you have weakness in your muscles, organs that are falling out, decreased sensation in your pelvis, or ignore your urge to go, you may find yourself straining to have a bowel movement.  You are straining if you are: . holding your breath or taking in a huge gulp of air and holding it  . keeping your lips and jaw tensed and closed tightly . turning red in the face because of excessive pushing or forcing . developing or worsening your  hemorrhoids . getting faint while pushing . not emptying completely and have to defecate many times a day  If you are straining, you are actually making it harder for yourself to have a bowel movement.  Many people find they are pulling up with the pelvic floor muscles and closing off instead of opening the anus. Due to lack pelvic floor relaxation and coordination the abdominal muscles, one has to work harder to push the feces out.  Many people have never been taught how to defecate efficiently and effectively.  Notice what happens to your body when you are having a bowel movement.  While you are sitting on the toilet pay attention to the following areas: . Jaw and mouth position . Angle of your hips   . Whether your feet touch the ground or not . Arm placement  . Spine position . Waist . Belly tension . Anus (opening of the anal canal)  An Evacuation/Defecation Plan   Here are the 4 basic points:  1. Lean forward enough for your elbows to rest on your knees 2. Support your feet on the floor or use a low stool if your feet don't touch the floor  3. Push out your belly as if you have swallowed a beach ball--you should feel a widening of your waist 4. Open and relax your pelvic floor  muscles, rather than tightening around the anus       The following conditions my require modifications to your toileting posture:  . If you have had surgery in the past that limits your back, hip, pelvic, knee or ankle flexibility . Constipation   Your healthcare practitioner may make the following additional suggestions and adjustments:  1) Sit on the toilet  a) Make sure your feet are supported. b) Notice your hip angle and spine position--most people find it effective to lean forward or raise their knees, which can help the muscles around the anus to relax  c) When you lean forward, place your forearms on your thighs for support  2) Relax suggestions a) Breath deeply in through your nose and out slowly through your mouth as if you are smelling the flowers and blowing out the candles. b) To become aware of how to relax your muscles, contracting and releasing muscles can be helpful.  Pull your pelvic floor muscles in tightly by using the image of holding back gas, or closing around the anus (visualize making a circle smaller) and lifting the anus up and in.  Then release the muscles and your anus should drop down and feel open. Repeat 5 times ending with the feeling of relaxation. c) Keep your pelvic floor muscles relaxed; let your belly bulge out. d) The digestive tract starts at the mouth and ends at the anal opening, so  be sure to relax both ends of the tube.  Place your tongue on the roof of your mouth with your teeth separated.  This helps relax your mouth and will help to relax the anus at the same time.  3) Empty (defecation) a) Keep your pelvic floor and sphincter relaxed, then bulge your anal muscles.  Make the anal opening wide.  b) Stick your belly out as if you have swallowed a beach ball. c) Make your belly wall hard using your belly muscles while continuing to breathe. Doing this makes it easier to open your anus. d) Breath out and give a grunt (or try using other sounds  such as ahhhh, shhhhh, ohhhh or grrrrrrr).  4) Finish a) As you finish your bowel movement, pull the pelvic floor muscles up and in.  This will leave your anus in the proper place rather than remaining pushed out and down. If you leave your anus pushed out and down, it will start to feel as though that is normal and give you incorrect signals about needing to have a bowel movement. Access Code: [facility] URL: [email or web address] Date: 02/11/2021 Prepared by: Dwana Curd  Exercises Supine Lower Trunk Rotation - 1 x daily - 7 x weekly - 1 sets - 10 reps - 5 sec hold Supine Hamstring Stretch - 1 x daily - 7 x weekly - 1 sets - 3 reps - 30 sec hold Supine Piriformis Stretch - 1 x daily - 7 x weekly - 1 sets - 3 reps - 30 sec hold Supine Diaphragmatic Breathing - 3 x daily - 7 x weekly - 1 sets - 10 reps Supine Transversus Abdominis Bracing - Hands on Thighs - 1 x daily - 7 x weekly - 1 sets - 10 reps - 5 sec hold

## 2021-02-21 ENCOUNTER — Encounter: Payer: Medicaid Other | Admitting: Physical Therapy

## 2021-02-26 ENCOUNTER — Ambulatory Visit: Payer: Medicaid Other | Attending: Advanced Practice Midwife | Admitting: Physical Therapy

## 2021-02-26 ENCOUNTER — Other Ambulatory Visit: Payer: Self-pay

## 2021-02-26 DIAGNOSIS — G8929 Other chronic pain: Secondary | ICD-10-CM | POA: Diagnosis present

## 2021-02-26 DIAGNOSIS — M6281 Muscle weakness (generalized): Secondary | ICD-10-CM | POA: Diagnosis not present

## 2021-02-26 DIAGNOSIS — R279 Unspecified lack of coordination: Secondary | ICD-10-CM | POA: Diagnosis present

## 2021-02-26 DIAGNOSIS — M545 Low back pain, unspecified: Secondary | ICD-10-CM | POA: Insufficient documentation

## 2021-02-26 NOTE — Therapy (Signed)
[facility] [address] [place], [place], [address] Phone: [phone]   Fax:  [phone]  Physical Therapy Treatment  Patient Details  Name: Mary [name] MRN: [id number] Date of Birth: [birth year] Referring Provider (PT): Hurshel Party, CNM   Encounter Date: 02/26/2021   PT End of Session - 02/26/21 1627    Visit Number 3    Date for PT Re-Evaluation 04/22/21    Authorization Type [employer] medicaid - 3 until 02/28/21    PT Start Time 1623   arrived late   PT Stop Time 1703    PT Time Calculation (min) 40 min    Activity Tolerance Patient tolerated treatment well    Behavior During Therapy [facility] for tasks assessed/performed           Past Medical History:  Diagnosis Date  . Anxiety   . Asthma    as a child  . Chlamydia   . Depression   . History of gestational hypertension 11/27/2020  . Recurrent genital herpes 11/27/2020  . Trichomonas     Past Surgical History:  Procedure Laterality Date  . NO PAST SURGERIES      There were no vitals filed for this visit.   Subjective Assessment - 02/26/21 1633    Subjective Pt states she is drinking more water and urge is a little better.  Still having back pain and leakage with coughing/sneezing    Patient Stated Goals back pain managed and be able to carry infant    Currently in Pain? Yes    Pain Score 4     Pain Location Back    Pain Orientation Mid    Pain Descriptors / Indicators Burning    Pain Type Chronic pain              [facility] PT Assessment - 02/26/21 0001      Palpation   Palpation comment tight hip flexors and lumbar paraspinals, adductors                      Pelvic Floor Special Questions - 02/26/21 0001    How often 1-2/week    Fluid intake 3-5 bottle    Palpation tight levators and referred to abdomen    Strength good squeeze, good lift, able to hold agaisnt strong resistance             [facility] Adult PT  Treatment/Exercise - 02/26/21 0001      Lumbar Exercises: Stretches   Other Lumbar Stretch Exercise circles on ball; happy baby      Manual Therapy   Manual Therapy Myofascial release;Soft tissue mobilization    Soft tissue mobilization adductors and pelvic externally ischiocavernosis, lumbar paraspinals and gluteals bil    Myofascial Release lower abdomen                       PT Long Term Goals - 02/26/21 1631      PT LONG TERM GOAL #1   Title Pt will be ind with advanced HEP for getting back to regular exercise routine    Status On-going      PT LONG TERM GOAL #2   Title Pt will be able to cough and sneeze without leakage or at most 1-2 drops    Baseline at this time, soaks through underwear and pants and needs to change clothes with stress incontinence about 1-2/week    Status On-going  PT LONG TERM GOAL #3   Title Pt will report at least 50% less low back pain for improved functional lifting and standing    Baseline 6/10 pain on average, cannot stand up for more than 10-15 minutes    Status On-going      PT LONG TERM GOAL #4   Title Pt will have improved endurance of pelvic floor to at least 10 sec hold for reduced leakage    Baseline 4/5MMT 1 second hold    Status On-going                 Plan - 02/26/21 1707    Clinical Impression Statement Pt has improvements in symptoms from daily leakage to 1-2/week.  Still voids enough to have to change clothes at these times. Pt still has significant back pain that limites ability to drive and stand for more than 15 minutes.  Pt has pain up to 10/10,  Pt continues to have tight and TTP adductors and pelvic floor.  Pt will benefit from skilled PT to work on core strength and improved muscle length and coordination in order to improve overall function with minimal need for pain management.    PT Treatment/Interventions ADLs/Self Care Home Management;Biofeedback;Cryotherapy;Electrical Stimulation;Moist  Heat;Therapeutic exercise;Therapeutic activities;Neuromuscular re-education;Patient/family education;Manual techniques;Passive range of motion;Dry needling;Taping    PT Next Visit Plan sit on ball for relax; STM and MFR as needed; begin core and pelvic floor basic ex's    PT Home Exercise Plan Access Code: [email or web address]    Consulted and Agree with Plan of Care Patient           Patient will benefit from skilled therapeutic intervention in order to improve the following deficits and impairments:  Decreased endurance,Increased muscle spasms,Pain,Postural dysfunction,Impaired flexibility,Increased fascial restricitons,Decreased strength,Decreased coordination  Visit Diagnosis: Muscle weakness (generalized)  Unspecified lack of coordination  Chronic low back pain, unspecified back pain laterality, unspecified whether sciatica present     Problem List Patient Active Problem List   Diagnosis Date Noted  . ASCUS with positive high risk HPV cervical on 10/07/2019 12/27/2020  . Anxiety and depression   . Paragard IUD (intrauterine device) in place since 11/27/2020 11/27/2020    Brayton Caves Norris Brumbach, PT 02/26/2021, 5:12 PM  [facility] [address] [place], [place], [address] Phone: [phone]   Fax:  [phone]  Name: Mary [name] MRN: [id number] Date of Birth: May 19, 1996

## 2021-03-06 ENCOUNTER — Ambulatory Visit: Payer: Medicaid Other | Admitting: Physical Therapy

## 2021-03-15 ENCOUNTER — Ambulatory Visit: Payer: Medicaid Other | Admitting: Physical Therapy

## 2021-03-15 ENCOUNTER — Telehealth: Payer: Self-pay | Admitting: Physical Therapy

## 2021-03-15 NOTE — Telephone Encounter (Signed)
Pt called due to 2nd no show.  Unable to leave a message because mailbox is full.    Russella Dar, PT 03/15/21 11:30 AM

## 2021-03-21 ENCOUNTER — Encounter: Payer: Self-pay | Admitting: Physical Therapy

## 2021-03-21 ENCOUNTER — Ambulatory Visit: Payer: Medicaid Other | Admitting: Physical Therapy

## 2021-03-21 ENCOUNTER — Other Ambulatory Visit: Payer: Self-pay

## 2021-03-21 DIAGNOSIS — R279 Unspecified lack of coordination: Secondary | ICD-10-CM

## 2021-03-21 DIAGNOSIS — M6281 Muscle weakness (generalized): Secondary | ICD-10-CM

## 2021-03-21 DIAGNOSIS — G8929 Other chronic pain: Secondary | ICD-10-CM

## 2021-03-21 NOTE — Therapy (Signed)
[facility] [address] [place], [place], [address] Phone: [phone]   Fax:  [phone]  Physical Therapy Treatment  Patient Details  Name: Mary [name] MRN: [id number] Date of Birth: 11-Sep-1996 Referring Provider (PT): Hurshel Party, CNM   Encounter Date: 03/21/2021   PT End of Session - 03/21/21 1017     Visit Number 4    Date for PT Re-Evaluation 04/22/21    Authorization Type [employer] med[employer] - 6 until 04/12/21    PT Start Time 1016    PT Stop Time 1054    PT Time Calculation (min) 38 min    Activity Tolerance Patient tolerated treatment well    Behavior During Therapy [facility] for tasks assessed/performed             Past Medical History:  Diagnosis Date   Anxiety    Asthma    as a child   Chlamydia    Depression    History of gestational hypertension 11/27/2020   Recurrent genital herpes 11/27/2020   Trichomonas     Past Surgical History:  Procedure Laterality Date   NO PAST SURGERIES      There were no vitals filed for this visit.   Subjective Assessment - 03/21/21 1020     Subjective Pt states "I am a little sore."  Pt states she is drinking 9 glasses of water.    Patient Stated Goals back pain managed and be able to carry infant    Currently in Pain? Yes    Pain Score 8     Pain Location Back    Pain Orientation Lower    Pain Descriptors / Indicators Sore    Pain Type Chronic pain    Pain Onset More than a month ago    Aggravating Factors  not moving around    Pain Relieving Factors walking    Multiple Pain Sites No                               [facility] Adult PT Treatment/Exercise - 03/21/21 0001       Neuro Re-ed    Neuro Re-ed Details  circles on ball and pelvic tilt      Lumbar Exercises: Stretches   Lower Trunk Rotation 5 reps;10 seconds    Other Lumbar Stretch Exercise thoracic rotation 5 x      Lumbar Exercises: Supine   Other Supine Lumbar  Exercises breathing with TrA activation and bent knee fall out with TrA      Manual Therapy   Soft tissue mobilization bil lumbar paraspinals                         PT Long Term Goals - 03/21/21 1028       PT LONG TERM GOAL #1   Title Pt will be ind with advanced HEP for getting back to regular exercise routine    Status On-going      PT LONG TERM GOAL #2   Title Pt will be able to cough and sneeze without leakage or at most 1-2 drops    Baseline sometimes leaking when coughing or sneezing a little but doesn't saok pad and happens 1/week    Status On-going      PT LONG TERM GOAL #3   Title Pt will report at least 50% less low back pain for improved functional lifting and  standing    Baseline can stand 10-15 minutes    Status On-going                   Plan - 03/21/21 1040     Clinical Impression Statement Pt reports less leakage.  Pt has been having a lot of back pain so today's focused on mobility and basic core strength today. Pt reports 6/10 after today's treatment.  Pt was given stretch and exercise to continue to work on at home. pt is progressing slowly at this time due to stating she is struggling with exercises because of her depression. Pt will benefit from skilled PT to porgress core strength    PT Treatment/Interventions ADLs/Self Care Home Management;Biofeedback;Cryotherapy;Electrical Stimulation;Moist Heat;Therapeutic exercise;Therapeutic activities;Neuromuscular re-education;Patient/family education;Manual techniques;Passive range of motion;Dry needling;Taping    PT Next Visit Plan core strength and mobility    PT Home Exercise Plan Access Code: [email or web address]    Consulted and Agree with Plan of Care Patient             Patient will benefit from skilled therapeutic intervention in order to improve the following deficits and impairments:  Decreased endurance, Increased muscle spasms, Pain, Postural dysfunction, Impaired flexibility, Increased  fascial restricitons, Decreased strength, Decreased coordination  Visit Diagnosis: Muscle weakness (generalized)  Unspecified lack of coordination  Chronic low back pain, unspecified back pain laterality, unspecified whether sciatica present     Problem List Patient Active Problem List   Diagnosis Date Noted   ASCUS with positive high risk HPV cervical on 10/07/2019 12/27/2020   Anxiety and depression    Paragard IUD (intrauterine device) in place since 11/27/2020 11/27/2020    Brayton Caves Sung Parodi, PT 03/21/2021, 11:40 AM  [facility] [address] [place], [place], [address] Phone: [phone]   Fax:  [phone]  Name: Mary [name] MRN: [id number] Date of Birth: 12/19/1995

## 2021-03-28 ENCOUNTER — Ambulatory Visit: Payer: Medicaid Other | Attending: Advanced Practice Midwife | Admitting: Physical Therapy

## 2021-03-28 ENCOUNTER — Other Ambulatory Visit: Payer: Self-pay

## 2021-03-28 DIAGNOSIS — M545 Low back pain, unspecified: Secondary | ICD-10-CM | POA: Insufficient documentation

## 2021-03-28 DIAGNOSIS — M6281 Muscle weakness (generalized): Secondary | ICD-10-CM | POA: Diagnosis present

## 2021-03-28 DIAGNOSIS — G8929 Other chronic pain: Secondary | ICD-10-CM | POA: Insufficient documentation

## 2021-03-28 DIAGNOSIS — R279 Unspecified lack of coordination: Secondary | ICD-10-CM | POA: Insufficient documentation

## 2021-03-28 NOTE — Therapy (Signed)
[facility] [address] [place], [place], [address] Phone: [phone]   Fax:  [phone]  Physical Therapy Treatment  Patient Details  Name: Mary [name] MRN: [id number] Date of Birth: 1995-09-26 Referring Provider (PT): Elvera Maria, CNM   Encounter Date: 03/28/2021   PT End of Session - 03/28/21 1116     Visit Number 5    Date for PT Re-Evaluation 04/22/21    Authorization Type [employer] med[employer] - 6 until 04/12/21    PT Start Time 1016    PT Stop Time 1057    PT Time Calculation (min) 41 min    Activity Tolerance Patient tolerated treatment well    Behavior During Therapy [facility] for tasks assessed/performed             Past Medical History:  Diagnosis Date   Anxiety    Asthma    as a child   Chlamydia    Depression    History of gestational hypertension 11/27/2020   Recurrent genital herpes 11/27/2020   Trichomonas     Past Surgical History:  Procedure Laterality Date   NO PAST SURGERIES      There were no vitals filed for this visit.   Subjective Assessment - 03/28/21 1019     Subjective My left low back hurts.  Pt was unable to even sit up initially when she came to her appointment.  Pt was asked if she was experiencing depression and she said that she was and that she was not seeing a therapist but that she will get one to work on mental health.    Limitations Lifting    Patient Stated Goals back pain managed and be able to carry infant    Currently in Pain? Yes    Pain Score 9     Pain Location Back    Pain Orientation Lower    Pain Descriptors / Indicators Aching    Pain Type Chronic pain    Pain Onset More than a month ago    Aggravating Factors  i don't know it always hurts    Pain Relieving Factors lying down    Multiple Pain Sites No                               [facility] Adult PT Treatment/Exercise - 03/28/21 0001       Self-Care   Self-Care Other  Self-Care Comments    Other Self-Care Comments  educated in rolling tennis ball for gluteal release      Neuro Re-ed    Neuro Re-ed Details  circles on ball, bouncing, and pelvic tilt      Lumbar Exercises: Standing   Functional Squats 15 reps    Functional Squats Limitations touching elevated mat table    Other Standing Lumbar Exercises wall push up 15x      Lumbar Exercises: Seated   Hip Flexion on Ball Strengthening;Both;20 reps    Other Seated Lumbar Exercises UE flexion 3lb while sitting on ball      Manual Therapy   Soft tissue mobilization Lt gluteals                    PT Education - 03/28/21 1115     Education Details Access Code: [facility]    Person(s) Educated Patient    Methods Explanation;Demonstration;Tactile cues;Verbal cues;Handout    Comprehension Verbalized understanding;Returned demonstration  PT Long Term Goals - 03/28/21 1128       PT LONG TERM GOAL #1   Title Pt will be ind with advanced HEP for getting back to regular exercise routine    Status On-going      PT LONG TERM GOAL #2   Title Pt will be able to cough and sneeze without leakage or at most 1-2 drops    Baseline better    Status Partially Met      PT LONG TERM GOAL #3   Title Pt will report at least 50% less low back pain for improved functional lifting and standing    Status On-going      PT LONG TERM GOAL #4   Title Pt will have improved endurance of pelvic floor to at least 10 sec hold for reduced leakage    Status On-going                   Plan - 03/28/21 1038     Clinical Impression Statement Pt has been having more back pain on the left side and reports pain is 9/10 at start of treatment.  Pt felt like pain decreased at the end of today's session and that she felt more energized . Pt made some progress with doing exercises 4/7 days last week.  Pt encouraged to keep this up . Pt was able to progress difficulty level and added wall push up  and mini squats to HEP    Comorbidities chronic back pain; 4 vaginal deliveries; anxiety, IBS    Examination-Activity Limitations Continence;Lift;Caring for Others;Carry;Toileting;Stand    PT Treatment/Interventions ADLs/Self Care Home Management;Biofeedback;Cryotherapy;Electrical Stimulation;Moist Heat;Therapeutic exercise;Therapeutic activities;Neuromuscular re-education;Patient/family education;Manual techniques;Passive range of motion;Dry needling;Taping    PT Next Visit Plan core strength and mobility, progress standing exercises and f/u about if she found a therapist    PT Home Exercise Plan Access Code: [email or web address]    Consulted and Agree with Plan of Care Patient             Patient will benefit from skilled therapeutic intervention in order to improve the following deficits and impairments:  Decreased endurance, Increased muscle spasms, Pain, Postural dysfunction, Impaired flexibility, Increased fascial restricitons, Decreased strength, Decreased coordination  Visit Diagnosis: Muscle weakness (generalized)  Unspecified lack of coordination  Chronic low back pain, unspecified back pain laterality, unspecified whether sciatica present     Problem List Patient Active Problem List   Diagnosis Date Noted   ASCUS with positive high risk HPV cervical on 10/07/2019 12/27/2020   Anxiety and depression    Paragard IUD (intrauterine device) in place since 11/27/2020 11/27/2020    Camillo Flaming Miara Emminger, PT 03/28/2021, 11:29 AM  [facility] [address] [place], [place], [address] Phone: [phone]   Fax:  [phone]  Name: Mary [name] MRN: [id number] Date of Birth: March 24, 1996

## 2021-03-28 NOTE — Patient Instructions (Signed)
Access Code: [facility] URL: [email or web address] Date: 03/28/2021 Prepared by: Dwana Curd  Exercises Supine Lower Trunk Rotation - 1 x daily - 7 x weekly - 1 sets - 10 reps - 5 sec hold Supine Hamstring Stretch - 1 x daily - 7 x weekly - 1 sets - 3 reps - 30 sec hold Supine Piriformis Stretch - 1 x daily - 7 x weekly - 1 sets - 3 reps - 30 sec hold Supine Diaphragmatic Breathing - 3 x daily - 7 x weekly - 1 sets - 10 reps Supine Transversus Abdominis Bracing - Hands on Thighs - 1 x daily - 7 x weekly - 1 sets - 10 reps - 5 sec hold Supine Pelvic Floor Stretch - 1 x daily - 7 x weekly - 3 reps - 1 sets - 30 sec hold Sidelying Thoracic Rotation with Open Book - 1 x daily - 7 x weekly - 5 reps - 1 sets - 10 sec hold Bent Knee Fallouts - 1 x daily - 7 x weekly - 3 sets - 10 reps Wall Push Up - 1 x daily - 7 x weekly - 3 sets - 10 reps Quarter Squat with Table - 1 x daily - 7 x weekly - 3 sets - 10 reps

## 2021-04-04 ENCOUNTER — Ambulatory Visit: Payer: Medicaid Other | Admitting: Physical Therapy

## 2021-04-04 ENCOUNTER — Other Ambulatory Visit: Payer: Self-pay

## 2021-04-04 ENCOUNTER — Encounter: Payer: Self-pay | Admitting: Physical Therapy

## 2021-04-04 DIAGNOSIS — M6281 Muscle weakness (generalized): Secondary | ICD-10-CM | POA: Diagnosis not present

## 2021-04-04 DIAGNOSIS — R279 Unspecified lack of coordination: Secondary | ICD-10-CM

## 2021-04-04 DIAGNOSIS — G8929 Other chronic pain: Secondary | ICD-10-CM

## 2021-04-04 DIAGNOSIS — M545 Low back pain, unspecified: Secondary | ICD-10-CM

## 2021-04-04 NOTE — Therapy (Signed)
[facility] [address], [place], [place], [address] Phone: [phone]   Fax:  [phone]  Physical Therapy Treatment  Patient Details  Name: Mary [name] MRN: [id number] Date of Birth: 1996-05-05 Referring Provider (PT): Elvera Maria, CNM   Encounter Date: 04/04/2021   PT End of Session - 04/04/21 1027     Visit Number 6    Date for PT Re-Evaluation 04/22/21    Authorization Type [employer] med[employer] - 6 until 04/12/21    PT Start Time 1020    PT Stop Time 1058    PT Time Calculation (min) 38 min    Activity Tolerance Patient tolerated treatment well    Behavior During Therapy [facility] for tasks assessed/performed             Past Medical History:  Diagnosis Date   Anxiety    Asthma    as a child   Chlamydia    Depression    History of gestational hypertension 11/27/2020   Recurrent genital herpes 11/27/2020   Trichomonas     Past Surgical History:  Procedure Laterality Date   NO PAST SURGERIES      There were no vitals filed for this visit.   Subjective Assessment - 04/04/21 1027     Subjective Pt states she is feeling better since doing exercises 5 or 6 days this week.  Pt states back 5/10    Currently in Pain? Yes    Pain Score 5     Pain Location Back    Pain Orientation Lower;Left    Pain Descriptors / Indicators Aching    Pain Type Chronic pain                               [facility] Adult PT Treatment/Exercise - 04/04/21 0001       Lumbar Exercises: Stretches   Active Hamstring Stretch Right;Left;1 rep;60 seconds    Hip Flexor Stretch Right;Left;3 reps;20 seconds      Lumbar Exercises: Aerobic   Nustep L2 x 5 min warm up and status update      Lumbar Exercises: Standing   Functional Squats 15 reps   bil UE support with kegel   Other Standing Lumbar Exercises wall push up 15x    Other Standing Lumbar Exercises hip 3 ways 1lb ankle weight, stangin on foam mat  pallof press and shoulder ext red band      Lumbar Exercises: Seated   Hip Flexion on Ball Strengthening;Both;20 reps    Other Seated Lumbar Exercises UE flexion 3lb while sitting on ball and alt LE added                         PT Long Term Goals - 04/04/21 1030       PT LONG TERM GOAL #1   Title Pt will be ind with advanced HEP for getting back to regular exercise routine    Status On-going      PT LONG TERM GOAL #2   Title Pt will be able to cough and sneeze without leakage or at most 1-2 drops    Baseline did not have leakage this week    Status Partially Met      PT LONG TERM GOAL #3   Title Pt will report at least 50% less low back pain for improved functional lifting and standing  Plan - 04/04/21 1036     Clinical Impression Statement Pt was doing much better today.  She has done exercises and reports she got a new job in the mornings.  Pt was able to tolerate more exercises and resistance.  She needed cues for posture during the exercises today.  Pt will benefit from one more session to finialize HEP.    PT Treatment/Interventions ADLs/Self Care Home Management;Biofeedback;Cryotherapy;Electrical Stimulation;Moist Heat;Therapeutic exercise;Therapeutic activities;Neuromuscular re-education;Patient/family education;Manual techniques;Passive range of motion;Dry needling;Taping    PT Next Visit Plan core strength and mobility, progress standing exercises    PT Home Exercise Plan Access Code: [email or web address]    Consulted and Agree with Plan of Care Patient             Patient will benefit from skilled therapeutic intervention in order to improve the following deficits and impairments:  Decreased endurance, Increased muscle spasms, Pain, Postural dysfunction, Impaired flexibility, Increased fascial restricitons, Decreased strength, Decreased coordination  Visit Diagnosis: Muscle weakness (generalized)  Unspecified lack of  coordination  Chronic low back pain, unspecified back pain laterality, unspecified whether sciatica present     Problem List Patient Active Problem List   Diagnosis Date Noted   ASCUS with positive high risk HPV cervical on 10/07/2019 12/27/2020   Anxiety and depression    Paragard IUD (intrauterine device) in place since 11/27/2020 11/27/2020    Camillo Flaming Gessica Jawad, PT 04/04/2021, 10:59 AM  [place] [address] [place], [place], [address] Phone: [phone]   Fax:  [phone]  Name: Mary [name] MRN: [id number] Date of Birth: 11-Nov-1995

## 2021-04-11 ENCOUNTER — Other Ambulatory Visit: Payer: Self-pay

## 2021-04-11 ENCOUNTER — Ambulatory Visit: Payer: Medicaid Other | Admitting: Physical Therapy

## 2021-04-11 ENCOUNTER — Encounter: Payer: Self-pay | Admitting: Physical Therapy

## 2021-04-11 DIAGNOSIS — M6281 Muscle weakness (generalized): Secondary | ICD-10-CM | POA: Diagnosis not present

## 2021-04-11 DIAGNOSIS — G8929 Other chronic pain: Secondary | ICD-10-CM

## 2021-04-11 DIAGNOSIS — R279 Unspecified lack of coordination: Secondary | ICD-10-CM

## 2021-04-11 DIAGNOSIS — M545 Low back pain, unspecified: Secondary | ICD-10-CM

## 2021-04-11 NOTE — Patient Instructions (Signed)
Access Code: [facility] URL: [email or web address] Date: 04/11/2021 Prepared by: Dwana Curd  Exercises Supine Lower Trunk Rotation - 1 x daily - 7 x weekly - 1 sets - 10 reps - 5 sec hold Supine Hamstring Stretch - 1 x daily - 7 x weekly - 1 sets - 3 reps - 30 sec hold Supine Piriformis Stretch - 1 x daily - 7 x weekly - 1 sets - 3 reps - 30 sec hold Supine Diaphragmatic Breathing - 3 x daily - 7 x weekly - 1 sets - 10 reps Supine Transversus Abdominis Bracing - Hands on Thighs - 1 x daily - 7 x weekly - 1 sets - 10 reps - 5 sec hold Supine Pelvic Floor Stretch - 1 x daily - 7 x weekly - 3 reps - 1 sets - 30 sec hold Sidelying Thoracic Rotation with Open Book - 1 x daily - 7 x weekly - 5 reps - 1 sets - 10 sec hold Bent Knee Fallouts - 1 x daily - 7 x weekly - 3 sets - 10 reps Wall Push Up - 1 x daily - 7 x weekly - 3 sets - 10 reps Quarter Squat with Table - 1 x daily - 7 x weekly - 3 sets - 10 reps Quadriceps Stretch with Chair - 1 x daily - 7 x weekly - 3 sets - 10 reps Standing Bilateral Low Shoulder Row with Anchored Resistance - 1 x daily - 7 x weekly - 3 sets - 10 reps Shoulder extension with resistance - Neutral - 1 x daily - 7 x weekly - 3 sets - 10 reps

## 2021-04-11 NOTE — Therapy (Addendum)
[facility] [address], [place], [place], [address] Phone: [phone]   Fax:  [phone]  Physical Therapy Treatment  Patient Details  Name: Mary [name] MRN: [id number] Date of Birth: Sep 05, 1996 Referring Provider (PT): Elvera Maria, CNM   Encounter Date: 04/11/2021   PT End of Session - 04/11/21 0950     Visit Number 7    Date for PT Re-Evaluation 04/22/21    Authorization Type [employer] med[employer] - 6 until 04/12/21    PT Start Time 0943   late   PT Stop Time 1010    PT Time Calculation (min) 27 min    Activity Tolerance Patient tolerated treatment well    Behavior During Therapy [facility] for tasks assessed/performed             Past Medical History:  Diagnosis Date   Anxiety    Asthma    as a child   Chlamydia    Depression    History of gestational hypertension 11/27/2020   Recurrent genital herpes 11/27/2020   Trichomonas     Past Surgical History:  Procedure Laterality Date   NO PAST SURGERIES      There were no vitals filed for this visit.   Subjective Assessment - 04/11/21 0949     Subjective Pt states doing okay.  Pain is 3/10    Patient Stated Goals back pain managed and be able to carry infant    Currently in Pain? Yes    Pain Score 3     Pain Location Back    Pain Orientation Left;Lower    Pain Descriptors / Indicators Aching    Pain Type Chronic pain    Pain Onset More than a month ago    Multiple Pain Sites No                               [facility] Adult PT Treatment/Exercise - 04/11/21 0001       Lumbar Exercises: Stretches   Active Hamstring Stretch Right;Left;1 rep;60 seconds    Quad Stretch Right;Left;3 reps;30 seconds      Lumbar Exercises: Aerobic   Nustep L3 x 5 min warm up and status update      Lumbar Exercises: Standing   Functional Squats 15 reps   bil UE support with kegel   Row Strengthening;Power tower;Both;20 reps   20lb   Shoulder  Extension Strengthening;Power Tower;Both;20 reps   15lb   Other Standing Lumbar Exercises step up 4 inch 10 x each fwd and side      Lumbar Exercises: Seated   Hip Flexion on Ball Strengthening;Both;20 reps    Other Seated Lumbar Exercises UE flexion 3lb while sitting on ball and alt LE added                    PT Education - 04/11/21 1017     Education Details Access Code: [employer]    Person(s) Educated Patient    Methods Explanation;Demonstration;Tactile cues;Verbal cues;Handout    Comprehension Verbalized understanding;Returned demonstration                 PT Long Term Goals - 04/11/21 1023       PT LONG TERM GOAL #1   Title Pt will be ind with advanced HEP for getting back to regular exercise routine    Status Achieved      PT LONG TERM GOAL #2  Title Pt will be able to cough and sneeze without leakage or at most 1-2 drops    Status Achieved      PT LONG TERM GOAL #3   Title Pt will report at least 50% less low back pain for improved functional lifting and standing    Baseline able to work and standing for several hours    Status Achieved      PT LONG TERM GOAL #4   Title Pt will have improved endurance of pelvic floor to at least 10 sec hold for reduced leakage    Baseline did not re-test but not having leakage lately    Status Deferred                   Plan - 04/11/21 1020     Clinical Impression Statement Pt did well with exercises.  Her pain is down to 3/10 today and feels like she is okay being ind with her HEP.  Pt will discharge from skilled PT today.  Goals updated as shown in chart    PT Treatment/Interventions ADLs/Self Care Home Management;Biofeedback;Cryotherapy;Electrical Stimulation;Moist Heat;Therapeutic exercise;Therapeutic activities;Neuromuscular re-education;Patient/family education;Manual techniques;Passive range of motion;Dry needling;Taping    PT Next Visit Plan d/c today    PT Home Exercise Plan Access Code:  [email or web address]    Consulted and Agree with Plan of Care Patient             Patient will benefit from skilled therapeutic intervention in order to improve the following deficits and impairments:  Decreased endurance, Increased muscle spasms, Pain, Postural dysfunction, Impaired flexibility, Increased fascial restricitons, Decreased strength, Decreased coordination  Visit Diagnosis: Muscle weakness (generalized)  Unspecified lack of coordination  Chronic low back pain, unspecified back pain laterality, unspecified whether sciatica present     Problem List Patient Active Problem List   Diagnosis Date Noted   ASCUS with positive high risk HPV cervical on 10/07/2019 12/27/2020   Anxiety and depression    Paragard IUD (intrauterine device) in place since 11/27/2020 11/27/2020    Camillo Flaming Jayston Trevino, PT 04/11/2021, 10:29 AM  [facility] [address], [place], [place], [address] Phone: [phone]   Fax:  [phone]  Name: Mary [name] MRN: [id number] Date of Birth: 1996/08/28  PHYSICAL THERAPY DISCHARGE SUMMARY  Visits from Start of Care: 7  Current functional level related to goals / functional outcomes: See above goals   Remaining deficits: See above   Education / Equipment: HEP   Patient agrees to discharge. Patient goals were partially met. Patient is being discharged due to being pleased with the current functional level.  Gustavus Bryant, PT 04/11/21 11:46 AM

## 2022-03-10 ENCOUNTER — Telehealth: Payer: Self-pay

## 2022-03-10 NOTE — Telephone Encounter (Signed)
Left a message for the pt to call back and schedule her appt for her annual so we can fill her script for her valACYclovir (VALTREX) 500 MG

## 2022-04-05 NOTE — Progress Notes (Unsigned)
   ANNUAL EXAM Patient name: Mary [name] MRN [id number]  Date of birth: September 05, 1996 Chief Complaint:   No chief complaint on file.  History of Present Illness:   Mary [name] is a 26 y.o. [phone] female being seen today for a routine annual exam.  Current complaints: ***  Paragard placed 11/27/2020  Last pap 12/2020. Results were: NILM w/ HRHPV negative. H/O abnormal pap: yes ASCUS/HPV Pos 09/2019   Review of Systems:   Pertinent items are noted in HPI Denies any headaches, blurred vision, fatigue, shortness of breath, chest pain, abdominal pain, abnormal vaginal discharge/itching/odor/irritation, problems with periods, bowel movements, urination, or intercourse unless otherwise stated above. *** Pertinent History Reviewed:  Reviewed past medical,surgical, social and family history.  Reviewed problem list, medications and allergies. Physical Assessment:  There were no vitals filed for this visit.There is no height or weight on file to calculate BMI.   Physical Examination:  General appearance - well appearing, and in no distress Mental status - alert, oriented to person, place, and time Psych:  She has a normal mood and affect Skin - warm and dry, normal color, no suspicious lesions noted Chest - effort normal, all lung fields clear to auscultation bilaterally Heart - normal rate and regular rhythm Neck:  midline trachea, no thyromegaly or nodules Breasts - breasts appear normal, no suspicious masses, no skin or nipple changes or axillary nodes Abdomen - soft, nontender, nondistended, no masses or organomegaly Pelvic -  VULVA: normal appearing vulva with no masses, tenderness or lesions  VAGINA: normal appearing vagina with normal color and discharge, no lesions   CERVIX: normal appearing cervix without discharge or lesions, no CMT UTERUS: uterus is felt to be normal size, shape, consistency and nontender  ADNEXA: No adnexal masses or tenderness noted. Extremities:  No swelling  or varicosities noted  Chaperone present for exam  No results found for this or any previous visit (from the past 24 hour(s)).  Assessment & Plan:  Diagnoses and all orders for this visit:  Encounter for annual routine gynecological examination  - Cervical cancer screening: Discussed screening Q3 years. Reviewed importance of annual exams and limits of pap smear. Pap with HPV in follow up from ASCUS/HPV Pos in 2021 - GC/CT: Discussed and recommended. Pt  {Blank single:19197::"accepts","declines"} - Gardasil: completed - Birth Control: Paragard IUD 2022 - Breast Health: Encouraged self breast awareness/exams. Teaching provided. - Follow-up: 12 months and prn     No orders of the defined types were placed in this encounter.   Meds: No orders of the defined types were placed in this encounter.   Follow-up: No follow-ups on file.  Milas Hock, MD 04/05/2022 6:03 PM

## 2022-04-07 ENCOUNTER — Encounter: Payer: Self-pay | Admitting: Obstetrics and Gynecology

## 2022-04-07 ENCOUNTER — Other Ambulatory Visit (HOSPITAL_COMMUNITY)
Admission: RE | Admit: 2022-04-07 | Discharge: 2022-04-07 | Disposition: A | Discharge status: Home / Self Care | Payer: Medicaid Other | Source: Ambulatory Visit | Attending: Obstetrics and Gynecology | Admitting: Obstetrics and Gynecology

## 2022-04-07 ENCOUNTER — Ambulatory Visit (INDEPENDENT_AMBULATORY_CARE_PROVIDER_SITE_OTHER): Payer: Medicaid Other | Admitting: Obstetrics and Gynecology

## 2022-04-07 VITALS — BP 109/76 | HR 76 | Ht 65.0 in | Wt 232.0 lb

## 2022-04-07 DIAGNOSIS — F32A Depression, unspecified: Secondary | ICD-10-CM

## 2022-04-07 DIAGNOSIS — A599 Trichomoniasis, unspecified: Secondary | ICD-10-CM

## 2022-04-07 DIAGNOSIS — Z01419 Encounter for gynecological examination (general) (routine) without abnormal findings: Secondary | ICD-10-CM | POA: Insufficient documentation

## 2022-04-07 DIAGNOSIS — A6000 Herpesviral infection of urogenital system, unspecified: Secondary | ICD-10-CM | POA: Diagnosis not present

## 2022-04-07 DIAGNOSIS — Z789 Other specified health status: Secondary | ICD-10-CM

## 2022-04-07 DIAGNOSIS — B3731 Acute candidiasis of vulva and vagina: Secondary | ICD-10-CM

## 2022-04-07 MED ORDER — VALACYCLOVIR HCL 500 MG PO TABS: 500.0000 mg | ORAL_TABLET | Freq: Every day | ORAL | 12 refills | Status: AC

## 2022-04-07 MED ORDER — FLUOXETINE HCL 10 MG PO CAPS: 10.0000 mg | ORAL_CAPSULE | Freq: Every day | ORAL | 3 refills | Status: DC

## 2022-04-09 LAB — CYTOLOGY - PAP
Chlamydia: NEGATIVE
Comment: NEGATIVE
Comment: NEGATIVE
Comment: NORMAL
Diagnosis: NEGATIVE
Neisseria Gonorrhea: NEGATIVE
Trichomonas: POSITIVE — AB

## 2022-04-09 MED ORDER — FLUCONAZOLE 150 MG PO TABS: 150.0000 mg | ORAL_TABLET | Freq: Every day | ORAL | 1 refills | Status: DC

## 2022-04-09 MED ORDER — METRONIDAZOLE 500 MG PO TABS: ORAL_TABLET | ORAL | 0 refills | Status: DC

## 2022-04-09 NOTE — Addendum Note (Signed)
Addended by: Milas Hock A on: 04/09/2022 12:40 PM   Modules accepted: Orders

## 2022-04-22 ENCOUNTER — Telehealth: Payer: Self-pay

## 2022-04-22 NOTE — Telephone Encounter (Signed)
Pt returned my call. Pt is aware she is positive for Trich and states she already picked up the medication. Pt is also aware that her partner needs to be tested and treated as well and they should abstain from intercourse. Pt expressed understanding.

## 2022-04-22 NOTE — Telephone Encounter (Signed)
Pt has not read previous MyChart message about positive Trich results. I attempted to call pt. Pt did not answer. VM left asking pt to call office or check MyChart message for results.

## 2022-05-06 ENCOUNTER — Telehealth: Payer: Self-pay | Admitting: Licensed Clinical Social Worker

## 2022-05-06 ENCOUNTER — Telehealth: Payer: Medicaid Other | Admitting: Licensed Clinical Social Worker

## 2022-05-06 NOTE — Telephone Encounter (Signed)
Called pt regarding scheduled mychart visit. Pt no show mychart visit for 05/03/2022. Left message requesting callback

## 2022-07-02 ENCOUNTER — Ambulatory Visit (INDEPENDENT_AMBULATORY_CARE_PROVIDER_SITE_OTHER): Payer: Medicaid Other | Admitting: Family Medicine

## 2022-07-02 ENCOUNTER — Encounter (INDEPENDENT_AMBULATORY_CARE_PROVIDER_SITE_OTHER): Payer: Self-pay | Admitting: Family Medicine

## 2022-07-02 VITALS — BP 117/76 | HR 72 | Temp 97.7°F | Ht 66.0 in | Wt 227.0 lb

## 2022-07-02 DIAGNOSIS — R739 Hyperglycemia, unspecified: Secondary | ICD-10-CM

## 2022-07-02 DIAGNOSIS — R0602 Shortness of breath: Secondary | ICD-10-CM | POA: Diagnosis not present

## 2022-07-02 DIAGNOSIS — Z6836 Body mass index (BMI) 36.0-36.9, adult: Secondary | ICD-10-CM | POA: Diagnosis not present

## 2022-07-17 NOTE — Progress Notes (Signed)
     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 Walks but not as frequently as she wants and has a Higher education careers adviser to [employer]. Started gaining weight on Depo shots. Lives with boyfriend and 5 kids (21yr, 74yr, 21yr, 70yr, 54yr old).   Subjective:   1. Hyperglycemia Prior blood sugars elevated sporadically. A1c within normal limits 2 years ago.  2. SOBOE (shortness of breath on exertion) Saw Pulmonology previously. Sleep study ordered.  Assessment/Plan:   1. Hyperglycemia Labs ordered for next appointment.  - Hemoglobin A1c; Future - Insulin, random  2. SOBOE (shortness of breath on exertion) Follow up on sleep study.  3. Class 2 severe obesity with serious comorbidity and body mass index (BMI) of 36.0 to 36.9 in adult, unspecified obesity type [facility]) Exercise goals: No exercise has been prescribed at this time.  Behavioral modification strategies: increasing lean protein intake, meal planning and cooking strategies, keeping healthy foods in the home, and planning for success.  Sofia has agreed to follow-up with our clinic in 2 weeks. She was informed of the importance of frequent follow-up visits to maximize her success with intensive lifestyle modifications for her multiple health conditions.   Objective:   Blood pressure 117/76, pulse 72, temperature 97.7 F (36.5 C), height 5\' 6"  (1.676 m), weight 227 lb (103 kg), SpO2 98 %, unknown if currently breastfeeding. Body mass index is 36.64 kg/m.  General: Cooperative, alert, well developed, in no acute distress. HEENT: Conjunctivae and lids unremarkable. Cardiovascular: Regular rhythm.  Lungs: Normal work of breathing. Neurologic: No focal deficits.   Lab Results  Component Value Date   CREATININE 0.50 10/23/2020   BUN 8 10/23/2020   NA 136  10/23/2020   K 3.1 (L) 10/23/2020   CL 109 10/23/2020   CO2 18 (L) 10/23/2020   Lab Results  Component Value Date   ALT 14 10/23/2020   AST 23 10/23/2020   ALKPHOS 165 (H) 10/23/2020   BILITOT 0.7 10/23/2020   No results found for: "HGBA1C" No results found for: "INSULIN" No results found for: "TSH" No results found for: "CHOL", "HDL", "LDLCALC", "LDLDIRECT", "TRIG", "CHOLHDL" No results found for: "VD25OH" Lab Results  Component Value Date   WBC 9.9 10/23/2020   HGB 11.8 (L) 10/23/2020   HCT 36.4 10/23/2020   MCV 85.8 10/23/2020   PLT 161 10/23/2020   No results found for: "IRON", "TIBC", "FERRITIN"  Attestation Statements:   Reviewed by clinician on day of visit: allergies, medications, problem list, medical history, surgical history, family history, social history, and previous encounter notes.  Time spent on visit including pre-visit chart review and post-visit care and charting was 40 minutes.   I, Elnora Morrison, RMA am acting as transcriptionist for Coralie Common, MD.  I have reviewed the above documentation for accuracy and completeness, and I agree with the above. - Coralie Common, MD

## 2022-07-24 ENCOUNTER — Encounter (INDEPENDENT_AMBULATORY_CARE_PROVIDER_SITE_OTHER): Payer: Self-pay | Admitting: Family Medicine

## 2022-07-24 ENCOUNTER — Ambulatory Visit (INDEPENDENT_AMBULATORY_CARE_PROVIDER_SITE_OTHER): Payer: Medicaid Other | Admitting: Family Medicine

## 2022-07-24 VITALS — BP 104/62 | HR 84 | Temp 97.5°F | Ht 66.0 in | Wt 223.0 lb

## 2022-07-24 DIAGNOSIS — R5383 Other fatigue: Secondary | ICD-10-CM

## 2022-07-24 DIAGNOSIS — E8809 Other disorders of plasma-protein metabolism, not elsewhere classified: Secondary | ICD-10-CM

## 2022-07-24 DIAGNOSIS — R0602 Shortness of breath: Secondary | ICD-10-CM

## 2022-07-24 DIAGNOSIS — R739 Hyperglycemia, unspecified: Secondary | ICD-10-CM

## 2022-07-24 DIAGNOSIS — Z6836 Body mass index (BMI) 36.0-36.9, adult: Secondary | ICD-10-CM

## 2022-07-26 LAB — HEMOGLOBIN A1C
Est. average glucose Bld gHb Est-mCnc: 97 mg/dL
Hgb A1c MFr Bld: 5 % (ref 4.8–5.6)

## 2022-07-26 LAB — INSULIN, RANDOM: INSULIN: 17.5 u[IU]/mL (ref 2.6–24.9)

## 2022-07-28 NOTE — Progress Notes (Signed)
Note was lost

## 2022-10-10 ENCOUNTER — Ambulatory Visit (HOSPITAL_COMMUNITY)
Admission: EM | Admit: 2022-10-10 | Discharge: 2022-10-10 | Disposition: A | Discharge status: Home / Self Care | Payer: Medicaid Other | Attending: Nurse Practitioner | Admitting: Nurse Practitioner

## 2022-10-10 DIAGNOSIS — F411 Generalized anxiety disorder: Secondary | ICD-10-CM

## 2022-10-10 DIAGNOSIS — F320 Major depressive disorder, single episode, mild: Secondary | ICD-10-CM

## 2022-10-10 MED ORDER — SERTRALINE HCL 25 MG PO TABS: 25.0000 mg | ORAL_TABLET | Freq: Every day | ORAL | 0 refills | Status: DC

## 2022-10-10 NOTE — BH Assessment (Signed)
LCSW Progress Note   Per Lucia Bitter, NP, this pt does not require psychiatric hospitalization at this time.  Pt is psychiatrically cleared.  Discharge instructions include contact information for several providers offering basic mental health services such as outpatient therapy and medication management.     Omelia Blackwater, MSW, [place] [phone] or [phone]

## 2022-10-10 NOTE — Discharge Instructions (Addendum)
Discharge recommendations:  Patient is to take medications as prescribed. Please see information for follow-up appointment with psychiatry and therapy. Please follow up with your primary care provider for all medical related needs.   Therapy: We recommend that patient participate in individual therapy to address mental health concerns.  Medications: The patient or guardian is to contact a medical professional and/or outpatient provider to address any new side effects that develop. The patient or guardian should update outpatient providers of any new medications and/or medication changes.   Atypical antipsychotics: If you are prescribed an atypical antipsychotic, it is recommended that your height, weight, BMI, blood pressure, fasting lipid panel, and fasting blood sugar be monitored by your outpatient providers.  Safety:  The patient should abstain from use of illicit substances/drugs and abuse of any medications. If symptoms worsen or do not continue to improve or if the patient becomes actively suicidal or homicidal then it is recommended that the patient return to the closest hospital emergency department, the [facility], or call 911 for further evaluation and treatment. National Suicide Prevention Lifeline 1-800-SUICIDE or 248 667 9963.  About 988 988 offers 24/7 access to trained crisis counselors who can help people experiencing mental health-related distress. People can call or text 988 or chat 988lifeline.org for themselves or if they are worried about a loved one who may need crisis support.  Crisis Mobile: [facility]:                     (432) 056-3027 (for crisis response 24 hours a day) [employer]:                                            (213)294-0066  Based on what you have shared, a list of resources for outpatient therapy and psychiatry is provided below to get you started back on treatment.  It is imperative that  you follow through with treatment within 5-7 days from the day of discharge to prevent any further risk to your safety or mental well-being.  You are not limited to the list provided.  In case of an urgent crisis, you may contact the Mobile Crisis Unit with [facility] at 1.(908)811-4066.       In case of an urgent emergency, you have the option of contacting the Mobile Crisis Unit with [facility] at 1.(908)811-4066.    Outpatient Services for Therapy and Medication Management for [facility] [place], [place], [address] [phone] phone  New Patient Assessment/Therapy Walk-ins Monday and Wednesday: 8am until slots are full. Every 1st and 2nd Friday: 1pm - 5pm  NO ASSESSMENT/THERAPY WALK-INS ON [place]  New Patient Psychiatry/Medication Management Walk-ins Monday-Friday: 8am-11am  For all walk-ins, we ask that you arrive by 7:30am because patient will be seen in the order of arrival.  Availability is limited; therefore, you may not be seen on the same day that you walk-in.  Our goal is to serve and meet the needs of our community to the best of our ability.   [facility] [address] [place], [place], [address] [phone] phone  [facility], [place], [place], [address] [phone] phone [phone] phone ([address], AmeriHealth, Anthem/Elevance, BCBS, Centivo, Cigna/[place], ComPsych, Healthy [place], [place], [place], [place], [place],  [place], Out of Network)  [place], [address] [place], [place], [address] [phone] phone ([address], Anthem/Elevance, [employer] Options/Carelon, [place], [place], [place], [place], [place], [employer], [place], [place], [place], [facility])  [place] [phone] [place], [place], [address] [phone] phone (Medicaid, ask about other insurance)  [facility] [address] [place], [place], [address] [phone] phone [phone] fax ([address] [place] , [place], [place], [place], UHC, [employer], Self-Pay)  Evangeline Dakin [place] [place], [place], [address] [phone] phone ([address], Anthem/Elevance, [place], [place], [place], [place], [place], [employer], [place], [place], [place], [facility])  [place] - 6-8 MONTH WAIT FOR THERAPY; [facility] [address] [place], [place], [address] [phone] phone ([address] [place], [place], [place], [place], Friday Health Plans, [place], [place], [place], [place], [place], [place], Tricare, UHC, [employer], [place])  [facility] [address], [place], [place], [address] [phone] phone  [place] [address], [place], [place], [address] [phone] phone  [facility] [address], [place], [place], [address] [phone] phone  [facility] [address] [place], [address] [phone] phone  [facility], [place] [address], [place], [address] [phone] phone  [facility] [address] [place], [place], [address] [phone] phone [phone] fax  [facility] [address] [place], [place], [address] [phone] phone [phone] fax  [facility] [phone] [address] [place], [place], [address] [phone] phone  Jinny Blossom [address] [place], [place], [address]  [phone] phone  [facility]  (Adults Only) [address] [place], [place], [address]  [phone]  phone [phone] fax

## 2022-10-10 NOTE — ED Provider Notes (Signed)
Behavioral Health Urgent Care Medical Screening Exam  Patient Name: Mary [name] MRN: [id number] Date of Evaluation: 10/13/22 Chief Complaint:  Anxiety and Depression Diagnosis:  Final diagnoses:  GAD (generalized anxiety disorder)  Current mild episode of major depressive disorder without prior episode ([place])    History of Present illness: Mary [name] is a 27 y.o. female with history of generalized anxiety disorder, adhd and post partum depression presenting voluntarily to [facility]. Patient reports that she feels like " I have not done anything with my life since high school". Patient states that she got emotional at work today and felt like she was being unfairly criticized about her job performance. Patient reports that other people at her job are on their phone and it is not a problem, but if she answers her phone her supervisor always says something to her and not other people. Patient feels overwhelmed, scattered, having racing thoughts, poor sleep, irritability, and feels like she is being picked on by her co-workers. 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 Patient denies any alcohol use disorder or substance abuse disorder. Patient reports that she wants to be connected to services for therapy and medication management. Patient reports that she was diagnosed with post-partum depression in 2022 after the birth of her last child and she was prescribed zoloft and she found it helpful. Patient reports that she was bullied in high school and was hospitalized in [place] when she was 27 y/o for wrapping a belt around her neck.  Patient denies any SI/HI or AVH.  Patient is alert oriented x4, calm and cooperative, speech is clear and coherent, thought process is linear and goal directed.  There is no indication that she is currently responding to internal/external stimuli or experiencing delusional thought content.  Denies homicidal ideations.  Denies auditory and visual  hallucinations. Patient denies feelings of paranoia. Patient given a prescription for zoloft 25 mg for anxiety and depression symptoms. Patient is able to contract for safety and can be safely discharged home with resources.   [facility] from 10/10/2022 in [facility] (Discharged) from 10/10/2020 in [facility] (Discharged) from 10/09/2020 in [facility]  C-SSRS RISK CATEGORY No Risk No Risk No Risk       Psychiatric Specialty Exam  Presentation  General Appearance:Casual  Eye Contact:Good  Speech:Clear and Coherent  Speech Volume:Normal  Handedness:Right   Mood and Affect  Mood: Anxious; Depressed  Affect: Congruent   Thought Process  Thought Processes: Coherent  Descriptions of Associations:Intact  Orientation:Full (Time, Place and Person)  Thought Content:WDL    Hallucinations:None  Ideas of Reference:None  Suicidal Thoughts:No  Homicidal Thoughts:No   Sensorium  Memory: Immediate Good; Recent Good; Remote Good  Judgment: Good  Insight: Good   Executive Functions  Concentration: Good  Attention Span: Good  Recall: Good  Fund of Knowledge: Good  Language: Good   Psychomotor Activity  Psychomotor Activity: Normal   Assets  Assets: Communication Skills; Desire for Improvement; Housing; Resilience   Sleep  Sleep: Fair  Number of hours:  -1   No data recorded  Physical Exam: Physical Exam HENT:     Head: Normocephalic and atraumatic.     Nose: Nose normal.  Eyes:     Pupils: Pupils are equal, round, and reactive to light.  Cardiovascular:     Rate and Rhythm: Normal rate.  Pulmonary:     Effort: Pulmonary effort  is normal.  Abdominal:     General: Abdomen is flat.  Musculoskeletal:        General: Normal range of motion.     Cervical back: Normal range of motion.  Skin:    General: Skin is warm.  Neurological:      Mental Status: She is alert and oriented to person, place, and time.  Psychiatric:        Attention and Perception: Attention normal.        Mood and Affect: Mood is anxious and depressed.        Speech: Speech normal.        Behavior: Behavior normal.        Thought Content: Thought content normal. Thought content is not paranoid or delusional. Thought content does not include homicidal or suicidal ideation. Thought content does not include homicidal or suicidal plan.        Cognition and Memory: Cognition normal.    Review of Systems  Constitutional: Negative.   HENT: Negative.    Eyes: Negative.   Respiratory: Negative.    Cardiovascular: Negative.   Gastrointestinal: Negative.   Genitourinary: Negative.   Musculoskeletal: Negative.   Skin: Negative.   Neurological: Negative.   Endo/Heme/Allergies: Negative.   Psychiatric/Behavioral:  Positive for depression. Negative for substance abuse and suicidal ideas. The patient is nervous/anxious.    Blood pressure (!) 145/94, pulse 86, temperature 98.1 F (36.7 C), resp. rate 18, SpO2 99 %, unknown if currently breastfeeding. There is no height or weight on file to calculate BMI.  Musculoskeletal: Strength & Muscle Tone: within normal limits Gait & Station: normal Patient leans: N/A   [place] [facility] Discharge Disposition for Follow up and Recommendations: Based on my evaluation the patient does not appear to have an emergency medical condition and can be discharged with resources and follow up care in outpatient services for Medication Management and Individual Therapy   Lucia Bitter, NP 10/13/2022, 6:11 AM

## 2023-01-21 DIAGNOSIS — Z0289 Encounter for other administrative examinations: Secondary | ICD-10-CM

## 2023-01-28 ENCOUNTER — Ambulatory Visit (INDEPENDENT_AMBULATORY_CARE_PROVIDER_SITE_OTHER): Payer: Medicaid Other | Admitting: Family Medicine

## 2023-01-28 ENCOUNTER — Encounter (INDEPENDENT_AMBULATORY_CARE_PROVIDER_SITE_OTHER): Payer: Self-pay | Admitting: Family Medicine

## 2023-01-28 VITALS — BP 106/57 | HR 78 | Temp 97.6°F | Ht 66.0 in | Wt 215.0 lb

## 2023-01-28 DIAGNOSIS — Z6834 Body mass index (BMI) 34.0-34.9, adult: Secondary | ICD-10-CM

## 2023-01-28 DIAGNOSIS — F419 Anxiety disorder, unspecified: Secondary | ICD-10-CM | POA: Diagnosis not present

## 2023-01-28 DIAGNOSIS — E669 Obesity, unspecified: Secondary | ICD-10-CM

## 2023-01-28 DIAGNOSIS — R5383 Other fatigue: Secondary | ICD-10-CM

## 2023-01-28 DIAGNOSIS — R0602 Shortness of breath: Secondary | ICD-10-CM

## 2023-01-28 DIAGNOSIS — E559 Vitamin D deficiency, unspecified: Secondary | ICD-10-CM

## 2023-01-28 DIAGNOSIS — E162 Hypoglycemia, unspecified: Secondary | ICD-10-CM

## 2023-01-28 DIAGNOSIS — F32A Depression, unspecified: Secondary | ICD-10-CM

## 2023-01-28 MED ORDER — SERTRALINE HCL 25 MG PO TABS
25.0000 mg | ORAL_TABLET | Freq: Every day | ORAL | 0 refills | Status: DC
Start: 2023-01-28 — End: 2023-04-07

## 2023-01-28 NOTE — Progress Notes (Signed)
Chief Complaint:   OBESITY Mary [name] (MR# [id number]) is a 27 y.o. female who presents for evaluation and treatment of obesity and related comorbidities. Current BMI is Body mass index is 34.7 kg/m. Mary [name] has been struggling with her weight for many years and has been unsuccessful in either losing weight, maintaining weight loss, or reaching her healthy weight goal.  Mary [name] is currently in the action stage of change and ready to dedicate time achieving and maintaining a healthier weight. Mary [name] is interested in becoming our patient and working on intensive lifestyle modifications including (but not limited to) diet and exercise for weight loss.  Patient came previously in November of 2023 as a nutritional consult.  She voices that she feels like she is doing less snacking and got a new job.  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  She skips breakfast most of the time and normally eats lunch around 5 or 5:30 and then again at 8pm.  Homemade hamburgers (1 burger) and baked fries (3/4 cup fries).  Lives at home with boyfriend, and 3 kids.  Her son stays back and forth with her and her uncle.  Her family is supportive of her and everyone will be changing how they eat. Desired weight is 150 pounds and last time she was that weight was in middle school.  Only food dislike is asparagus.  Drinks juice, tea or milk at work and later at night.  Never logged or watched calorie or protein intake after last appointment.   Mary [name]'s habits were reviewed today and are as follows: Her family eats meals together, she thinks her family will eat healthier with her, her desired weight loss is 65 lbs, she has been heavy most of her life, she started gaining weight with Depo shot during 8th grade, her heaviest weight ever was 227 pounds, she has significant food cravings issues, she  snacks frequently in the evenings, she wakes up frequently in the middle of the night to eat, she skips meals frequently, she is frequently drinking liquids with calories, she frequently makes poor food choices, she has problems with excessive hunger, she frequently eats larger portions than normal, and she struggles with emotional eating.  Depression Screen Vall[place]'s Food and Mood (modified PHQ-9) score was 17.  Subjective:   1. Other fatigue Mary [name] admits to daytime somnolence and admits to waking up still tired. Patient has a history of symptoms of daytime fatigue and morning fatigue. Mary [name] generally gets 6 or 8 hours of sleep per night, and states that she has nightime awakenings. Snoring is present. Apneic episodes are not present. Epworth Sleepiness Score is 14.  EKG normal sinus rhythm at 82 bpm.  2. SOBOE (shortness of breath on exertion) Mary [name] notes increasing shortness of breath with exercising and seems to be worsening over time with weight gain. She notes getting out of breath sooner with activity than she used to. This has not gotten worse recently. Genora denies shortness of breath at rest or orthopnea.  IC done 6 months ago at 2100, but now 1627. She feels she has been making more conscious nutritious choices.  3. Vitamin D deficiency Mary [name]'s diagnosis is likely given obesity.  She notes fatigue.  4. Hypoglycemia Mary [name] has a history of lower blood sugars, but A1c within normal limits.  5. Anxiety and depression Mary [name]  notices sleep change, her energy change, concentration change, and psychomotor slowing.  Sertraline was ordered in January but not picked up.  Assessment/Plan:   1. Other fatigue Mary [name] does feel that her weight is causing her energy to be lower than it should be. Fatigue may be related to obesity, depression or many other causes. Labs will be ordered, and in the meanwhile, Mary [name] will focus on self care including making healthy food choices,  increasing physical activity and focusing on stress reduction.  EKG, IC, labs, and follow-up on sleep study results.  - EKG 12-Lead - TSH - T4, free - T3  2. SOBOE (shortness of breath on exertion) Mary [name] does feel that she gets out of breath more easily that she used to when she exercises. Yatziry's shortness of breath appears to be obesity related and exercise induced. She has agreed to work on weight loss and gradually increase exercise to treat her exercise induced shortness of breath. Will continue to monitor closely.  - CBC with Differential/Platelet - Lipid Panel With LDL/HDL Ratio  3. Vitamin D deficiency We will check labs today and follow-up at Mary [name]'s next visit.  - VITAMIN D 25 Hydroxy (Vit-D Deficiency, Fractures)  4. Hypoglycemia We will check labs today and follow-up at Mary [name]'s next visit.  - Comprehensive metabolic panel - Hemoglobin A1c - Insulin, random  5. Anxiety and depression Mary [name] agreed to start sertraline 25 mg p.o. daily with no refills.  - sertraline (ZOLOFT) 25 MG tablet; Take 1 tablet (25 mg total) by mouth daily.  Dispense: 30 tablet; Refill: 0  6. BMI 34.0-34.9,adult  7. Class 1 obesity with serious comorbidity and body mass index (BMI) of 34.0 to 34.9 in adult, unspecified obesity type Mary [name] is currently in the action stage of change and her goal is to continue with weight loss efforts. I recommend Mary [name] begin the structured treatment plan as follows:  She has agreed to the Category 2 Plan + 100 calories.  Exercise goals: No exercise has been prescribed at this time.   Behavioral modification strategies: increasing lean protein intake, no skipping meals, better snacking choices, and planning for success.  She was informed of the importance of frequent follow-up visits to maximize her success with intensive lifestyle modifications for her multiple health conditions. She was informed we would discuss her lab results at her next visit  unless there is a critical issue that needs to be addressed sooner. Mary [name] agreed to keep her next visit at the agreed upon time to discuss these results.  Objective:   Blood pressure (!) 106/57, pulse 78, temperature 97.6 F (36.4 C), height 5\' 6"  (1.676 m), weight 215 lb (97.5 kg), last menstrual period 12/25/2022, SpO2 98 %, unknown if currently breastfeeding. Body mass index is 34.7 kg/m.  EKG: Normal sinus rhythm, rate 82 BPM.  Indirect Calorimeter completed today shows a VO2 of 237 and a REE of 1627.  Her calculated basal metabolic rate is 4098 thus her basal metabolic rate is worse than expected.  General: Cooperative, alert, well developed, in no acute distress. HEENT: Conjunctivae and lids unremarkable. Cardiovascular: Regular rhythm.  Lungs: Normal work of breathing. Neurologic: No focal deficits.   Lab Results  Component Value Date   CREATININE 0.68 01/28/2023   BUN 13 01/28/2023   NA 142 01/28/2023   K 3.7 01/28/2023   CL 106 01/28/2023   CO2 19 (L) 01/28/2023   Lab Results  Component Value Date   ALT 8 01/28/2023   AST 17 01/28/2023  ALKPHOS 89 01/28/2023   BILITOT 0.5 01/28/2023   Lab Results  Component Value Date   HGBA1C 5.2 01/28/2023   HGBA1C 5.0 07/24/2022   Lab Results  Component Value Date   INSULIN 15.2 01/28/2023   INSULIN 17.5 07/24/2022   Lab Results  Component Value Date   TSH 2.490 01/28/2023   Lab Results  Component Value Date   CHOL 166 01/28/2023   HDL 31 (L) 01/28/2023   LDLCALC 109 (H) 01/28/2023   TRIG 144 01/28/2023   Lab Results  Component Value Date   WBC 6.7 01/28/2023   HGB 12.3 01/28/2023   HCT 37.7 01/28/2023   MCV 84 01/28/2023   PLT 256 01/28/2023   No results found for: "IRON", "TIBC", "FERRITIN"  Attestation Statements:   Reviewed by clinician on day of visit: allergies, medications, problem list, medical history, surgical history, family history, social history, and previous encounter notes.  Time  spent on visit including pre-visit chart review and post-visit charting and care was 45 minutes.   I, Burt Knack, am acting as transcriptionist for Reuben Likes, MD.  This is the patient's first visit at [facility]. The patient's NEW PATIENT PACKET was reviewed at length. Included in the packet: current and past health history, medications, allergies, ROS, gynecologic history (women only), surgical history, family history, social history, weight history, weight loss surgery history (for those that have had weight loss surgery), nutritional evaluation, mood and food questionnaire, PHQ9, Epworth questionnaire, sleep habits questionnaire, patient life and health improvement goals questionnaire. These will all be scanned into the patient's chart under media.   During the visit, I independently reviewed the patient's EKG, bioimpedance scale results, and indirect calorimeter results. I used this information to tailor a meal plan for the patient that will help her to lose weight and will improve her obesity-related conditions going forward. I performed a medically necessary appropriate examination and/or evaluation. I discussed the assessment and treatment plan with the patient. The patient was provided an opportunity to ask questions and all were answered. The patient agreed with the plan and demonstrated an understanding of the instructions. Labs were ordered at this visit and will be reviewed at the next visit unless more critical results need to be addressed immediately. Clinical information was updated and documented in the EMR.   I have reviewed the above documentation for accuracy and completeness, and I agree with the above. - Reuben Likes, MD

## 2023-01-29 LAB — CBC WITH DIFFERENTIAL/PLATELET
Basophils Absolute: 0 10*3/uL (ref 0.0–0.2)
Basos: 0 %
EOS (ABSOLUTE): 0.2 10*3/uL (ref 0.0–0.4)
Eos: 2 %
Hematocrit: 37.7 % (ref 34.0–46.6)
Hemoglobin: 12.3 g/dL (ref 11.1–15.9)
Immature Grans (Abs): 0 10*3/uL (ref 0.0–0.1)
Immature Granulocytes: 1 %
Lymphocytes Absolute: 2 10*3/uL (ref 0.7–3.1)
Lymphs: 30 %
MCH: 27.3 pg (ref 26.6–33.0)
MCHC: 32.6 g/dL (ref 31.5–35.7)
MCV: 84 fL (ref 79–97)
Monocytes Absolute: 0.4 10*3/uL (ref 0.1–0.9)
Monocytes: 6 %
Neutrophils Absolute: 4.1 10*3/uL (ref 1.4–7.0)
Neutrophils: 61 %
Platelets: 256 10*3/uL (ref 150–450)
RBC: 4.51 x10E6/uL (ref 3.77–5.28)
RDW: 13.2 % (ref 11.7–15.4)
WBC: 6.7 10*3/uL (ref 3.4–10.8)

## 2023-01-29 LAB — COMPREHENSIVE METABOLIC PANEL
ALT: 8 IU/L (ref 0–32)
AST: 17 IU/L (ref 0–40)
Albumin/Globulin Ratio: 1.8 (ref 1.2–2.2)
Albumin: 4.3 g/dL (ref 4.0–5.0)
Alkaline Phosphatase: 89 IU/L (ref 44–121)
BUN/Creatinine Ratio: 19 (ref 9–23)
BUN: 13 mg/dL (ref 6–20)
Bilirubin Total: 0.5 mg/dL (ref 0.0–1.2)
CO2: 19 mmol/L — ABNORMAL LOW (ref 20–29)
Calcium: 8.7 mg/dL (ref 8.7–10.2)
Chloride: 106 mmol/L (ref 96–106)
Creatinine, Ser: 0.68 mg/dL (ref 0.57–1.00)
Globulin, Total: 2.4 g/dL (ref 1.5–4.5)
Glucose: 73 mg/dL (ref 70–99)
Potassium: 3.7 mmol/L (ref 3.5–5.2)
Sodium: 142 mmol/L (ref 134–144)
Total Protein: 6.7 g/dL (ref 6.0–8.5)
eGFR: 122 mL/min/{1.73_m2} (ref >59)

## 2023-01-29 LAB — LIPID PANEL WITH LDL/HDL RATIO
Cholesterol, Total: 166 mg/dL (ref 100–199)
HDL: 31 mg/dL — ABNORMAL LOW (ref >39)
LDL Chol Calc (NIH): 109 mg/dL — ABNORMAL HIGH (ref 0–99)
LDL/HDL Ratio: 3.5 ratio — ABNORMAL HIGH (ref 0.0–3.2)
Triglycerides: 144 mg/dL (ref 0–149)
VLDL Cholesterol Cal: 26 mg/dL (ref 5–40)

## 2023-01-29 LAB — T3: T3, Total: 133 ng/dL (ref 71–180)

## 2023-01-29 LAB — INSULIN, RANDOM: INSULIN: 15.2 u[IU]/mL (ref 2.6–24.9)

## 2023-01-29 LAB — VITAMIN D 25 HYDROXY (VIT D DEFICIENCY, FRACTURES): Vit D, 25-Hydroxy: 11.9 ng/mL — ABNORMAL LOW (ref 30.0–100.0)

## 2023-01-29 LAB — TSH: TSH: 2.49 u[IU]/mL (ref 0.450–4.500)

## 2023-01-29 LAB — HEMOGLOBIN A1C
Est. average glucose Bld gHb Est-mCnc: 103 mg/dL
Hgb A1c MFr Bld: 5.2 % (ref 4.8–5.6)

## 2023-01-29 LAB — T4, FREE: Free T4: 0.91 ng/dL (ref 0.82–1.77)

## 2023-02-11 ENCOUNTER — Ambulatory Visit (INDEPENDENT_AMBULATORY_CARE_PROVIDER_SITE_OTHER): Payer: Medicaid Other | Admitting: Family Medicine

## 2023-03-02 ENCOUNTER — Telehealth (INDEPENDENT_AMBULATORY_CARE_PROVIDER_SITE_OTHER): Payer: Self-pay | Admitting: Family Medicine

## 2023-03-02 ENCOUNTER — Encounter (INDEPENDENT_AMBULATORY_CARE_PROVIDER_SITE_OTHER): Payer: Self-pay

## 2023-03-02 NOTE — Telephone Encounter (Signed)
Patient stated the last time she was here, she was not able to pick up sertraline. She stated it was not at pharmacy when she tried to pick up it.

## 2023-03-02 NOTE — Telephone Encounter (Signed)
Message sent to pt-CS 

## 2023-04-07 ENCOUNTER — Encounter (INDEPENDENT_AMBULATORY_CARE_PROVIDER_SITE_OTHER): Payer: Self-pay | Admitting: Family Medicine

## 2023-04-07 ENCOUNTER — Ambulatory Visit (INDEPENDENT_AMBULATORY_CARE_PROVIDER_SITE_OTHER): Payer: Medicaid Other | Admitting: Family Medicine

## 2023-04-07 VITALS — BP 96/62 | HR 93 | Temp 98.1°F | Ht 66.0 in | Wt 209.0 lb

## 2023-04-07 DIAGNOSIS — E559 Vitamin D deficiency, unspecified: Secondary | ICD-10-CM | POA: Diagnosis not present

## 2023-04-07 DIAGNOSIS — E669 Obesity, unspecified: Secondary | ICD-10-CM | POA: Diagnosis not present

## 2023-04-07 DIAGNOSIS — E66811 Obesity, class 1: Secondary | ICD-10-CM

## 2023-04-07 DIAGNOSIS — E785 Hyperlipidemia, unspecified: Secondary | ICD-10-CM

## 2023-04-07 DIAGNOSIS — E88819 Insulin resistance, unspecified: Secondary | ICD-10-CM | POA: Diagnosis not present

## 2023-04-07 DIAGNOSIS — Z6833 Body mass index (BMI) 33.0-33.9, adult: Secondary | ICD-10-CM

## 2023-04-07 MED ORDER — VITAMIN D (ERGOCALCIFEROL) 1.25 MG (50000 UNIT) PO CAPS
50000.0000 [IU] | ORAL_CAPSULE | ORAL | 0 refills | Status: DC
Start: 2023-04-07 — End: 2023-04-29

## 2023-04-07 NOTE — Progress Notes (Signed)
Chief Complaint:   OBESITY Mary [name] is here to discuss her progress with her obesity treatment plan along with follow-up of her obesity related diagnoses. Mary [name] is on the Category 2 Plan + 100 calories and states she is following her eating plan approximately 50% of the time. Mary [name] states she is doing 0 minutes 0 times per week.  Today's visit was #: 2 Starting weight: 215 lbs Starting date: 01/28/2023 Today's weight: 209 lbs Today's date: 04/07/2023 Total lbs lost to date: 6 Total lbs lost since last in-office visit: 6  Interim History: Patient presents for first follow up- initial appointment on 01/28/23.  She has had some increase in stress since that time. She is currently living with her mother and her kids are with her.  She is partially following the meal plan but does occasionally deviate off plan.  She is trying to be more mindful of incorporating more consistent healthy food choices into her intake.  In the next few weeks patient had her kid's open houses for school.    Subjective:   1. Dyslipidemia Patient's last LDL was 109, HDL 31, and triglycerides 161.  She is not on medications; age excludes ASCVD risk score stratification.  2. Vitamin D deficiency Patient is not on vitamin D, but notes fatigue.  Her last vitamin D level was of 11.9.  3. Insulin resistance Patient's recent A1c was within normal limits and insulin level elevated on initial labs.  Patient has been making more conscious controlled choices of food options since her initial appointment.  Assessment/Plan:   1. Dyslipidemia Patient will continue mindful eating on her category 2 plan +100 calories.   2. Vitamin D deficiency Patient agreed to start prescription vitamin D 50,000 IU once weekly with no refills.  - Vitamin D, Ergocalciferol, (DRISDOL) 1.25 MG (50000 UNIT) CAPS capsule; Take 1 capsule (50,000 Units total) by mouth every 7 (seven) days.  Dispense: 4 capsule; Refill: 0  3. Insulin  resistance Patient will continue her category 2 plan; pathophysiology of insulin resistance, prediabetes, and diabetes mellitus were discussed today.  4. BMI 33.0-33.9,adult  5. Obesity with starting BMI of 34.8 Mary [name] is currently in the action stage of change. As such, her goal is to continue with weight loss efforts. She has agreed to the Category 2 Plan + 100 calories.   Exercise goals: No exercise has been prescribed at this time.  Behavioral modification strategies: increasing lean protein intake, meal planning and cooking strategies, keeping healthy foods in the home, and planning for success.  Mary [name] has agreed to follow-up with our clinic in 2 to 3 weeks. She was informed of the importance of frequent follow-up visits to maximize her success with intensive lifestyle modifications for her multiple health conditions.   Objective:   Blood pressure 96/62, pulse 93, temperature 98.1 F (36.7 C), height 5\' 6"  (1.676 m), weight 209 lb (94.8 kg), last menstrual period 04/05/2023, SpO2 99%, unknown if currently breastfeeding. Body mass index is 33.73 kg/m.  General: Cooperative, alert, well developed, in no acute distress. HEENT: Conjunctivae and lids unremarkable. Cardiovascular: Regular rhythm.  Lungs: Normal work of breathing. Neurologic: No focal deficits.   Lab Results  Component Value Date   CREATININE 0.68 01/28/2023   BUN 13 01/28/2023   NA 142 01/28/2023   K 3.7 01/28/2023   CL 106 01/28/2023   CO2 19 (L) 01/28/2023   Lab Results  Component Value Date   ALT 8 01/28/2023   AST 17 01/28/2023  ALKPHOS 89 01/28/2023   BILITOT 0.5 01/28/2023   Lab Results  Component Value Date   HGBA1C 5.2 01/28/2023   HGBA1C 5.0 07/24/2022   Lab Results  Component Value Date   INSULIN 15.2 01/28/2023   INSULIN 17.5 07/24/2022   Lab Results  Component Value Date   TSH 2.490 01/28/2023   Lab Results  Component Value Date   CHOL 166 01/28/2023   HDL 31 (L) 01/28/2023    LDLCALC 109 (H) 01/28/2023   TRIG 144 01/28/2023   Lab Results  Component Value Date   VD25OH 11.9 (L) 01/28/2023   Lab Results  Component Value Date   WBC 6.7 01/28/2023   HGB 12.3 01/28/2023   HCT 37.7 01/28/2023   MCV 84 01/28/2023   PLT 256 01/28/2023   No results found for: "IRON", "TIBC", "FERRITIN"  Attestation Statements:   Reviewed by clinician on day of visit: allergies, medications, problem list, medical history, surgical history, family history, social history, and previous encounter notes.  Time spent on visit including pre-visit chart review and post-visit care and charting was 50 minutes.   I, Burt Knack, am acting as transcriptionist for Reuben Likes, MD.  I have reviewed the above documentation for accuracy and completeness, and I agree with the above. - Reuben Likes, MD

## 2023-04-29 ENCOUNTER — Ambulatory Visit (INDEPENDENT_AMBULATORY_CARE_PROVIDER_SITE_OTHER): Payer: Medicaid Other | Admitting: Family Medicine

## 2023-04-29 ENCOUNTER — Encounter (INDEPENDENT_AMBULATORY_CARE_PROVIDER_SITE_OTHER): Payer: Self-pay | Admitting: Family Medicine

## 2023-04-29 VITALS — BP 118/68 | HR 71 | Temp 97.5°F | Ht 66.0 in | Wt 211.0 lb

## 2023-04-29 DIAGNOSIS — E669 Obesity, unspecified: Secondary | ICD-10-CM | POA: Diagnosis not present

## 2023-04-29 DIAGNOSIS — I10 Essential (primary) hypertension: Secondary | ICD-10-CM | POA: Diagnosis not present

## 2023-04-29 DIAGNOSIS — Z6834 Body mass index (BMI) 34.0-34.9, adult: Secondary | ICD-10-CM | POA: Diagnosis not present

## 2023-04-29 DIAGNOSIS — E559 Vitamin D deficiency, unspecified: Secondary | ICD-10-CM | POA: Diagnosis not present

## 2023-04-29 MED ORDER — VITAMIN D (ERGOCALCIFEROL) 1.25 MG (50000 UNIT) PO CAPS
50000.0000 [IU] | ORAL_CAPSULE | ORAL | 0 refills | Status: AC
Start: 2023-04-29 — End: ?

## 2023-04-29 NOTE — Progress Notes (Signed)
Chief Complaint:   OBESITY Mary [name] is here to discuss her progress with her obesity treatment plan along with follow-up of her obesity related diagnoses. [name] is on the Category 2 Plan + 100 calories and states she is following her eating plan approximately 50% of the time. Mary [name] states she is doing 0 minutes 0 times per week.  Today's visit was #: 3 Starting weight: 215 lbs Starting date: 01/28/2023 Today's weight: 211 lbs Today's date: 04/29/2023 Total lbs lost to date: 4 Total lbs lost since last in-office visit: 0  Interim History: Patient went on vacation to [place] and [place] for vacation over the last few weeks.  She ate out frequently and did go to many of the different shops and activities there.  She realizes she ate off plan.  Next few weeks her kids are going back to school and she is hoping to get more consistent with meal plan.  She is looking for an apartment or house with her boyfriend.  Subjective:   1. Vitamin D deficiency Patient is on prescription vitamin D.  Her vitamin D level was very low on her new patient lab work.  2. Essential hypertension Patient is on amlodipine.  She denies chest pain, chest pressure, or headache.  Assessment/Plan:   1. Vitamin D deficiency Patient will continue prescription vitamin D once weekly, and we will refill for 1 month.  - Vitamin D, Ergocalciferol, (DRISDOL) 1.25 MG (50000 UNIT) CAPS capsule; Take 1 capsule (50,000 Units total) by mouth every 7 (seven) days.  Dispense: 4 capsule; Refill: 0  2. Essential hypertension Patient will continue amlodipine with no change in dose.  3. Obesity with starting BMI of 34.8  4. BMI 34.0-34.9,adult [name] is currently in the action stage of change. As such, her goal is to continue with weight loss efforts. She has agreed to the Category 2 Plan + 100 calories.   Exercise goals: No exercise has been prescribed at this time.  Behavioral modification strategies:  increasing lean protein intake, meal planning and cooking strategies, keeping healthy foods in the home, and planning for success.  [name] has agreed to follow-up with our clinic in 2 to 3 weeks. She was informed of the importance of frequent follow-up visits to maximize her success with intensive lifestyle modifications for her multiple health conditions.   Objective:   Blood pressure 118/68, pulse 71, temperature (!) 97.5 F (36.4 C), height 5\' 6"  (1.676 m), weight 211 lb (95.7 kg), last menstrual period 04/05/2023, SpO2 98%, unknown if currently breastfeeding. Body mass index is 34.06 kg/m.  General: Cooperative, alert, well developed, in no acute distress. HEENT: Conjunctivae and lids unremarkable. Cardiovascular: Regular rhythm.  Lungs: Normal work of breathing. Neurologic: No focal deficits.   Lab Results  Component Value Date   CREATININE 0.68 01/28/2023   BUN 13 01/28/2023   NA 142 01/28/2023   K 3.7 01/28/2023   CL 106 01/28/2023   CO2 19 (L) 01/28/2023   Lab Results  Component Value Date   ALT 8 01/28/2023   AST 17 01/28/2023   ALKPHOS 89 01/28/2023   BILITOT 0.5 01/28/2023   Lab Results  Component Value Date   HGBA1C 5.2 01/28/2023   HGBA1C 5.0 07/24/2022   Lab Results  Component Value Date   INSULIN 15.2 01/28/2023   INSULIN 17.5 07/24/2022   Lab Results  Component Value Date   TSH 2.490 01/28/2023   Lab Results  Component Value Date   CHOL 166 01/28/2023  HDL 31 (L) 01/28/2023   LDLCALC 109 (H) 01/28/2023   TRIG 144 01/28/2023   Lab Results  Component Value Date   VD25OH 11.9 (L) 01/28/2023   Lab Results  Component Value Date   WBC 6.7 01/28/2023   HGB 12.3 01/28/2023   HCT 37.7 01/28/2023   MCV 84 01/28/2023   PLT 256 01/28/2023   No results found for: "IRON", "TIBC", "FERRITIN"  Attestation Statements:   Reviewed by clinician on day of visit: allergies, medications, problem list, medical history, surgical history, family history,  social history, and previous encounter notes.   I, Burt Knack, am acting as transcriptionist for Reuben Likes, MD.  I have reviewed the above documentation for accuracy and completeness, and I agree with the above. - Reuben Likes, MD

## 2023-05-05 ENCOUNTER — Other Ambulatory Visit (INDEPENDENT_AMBULATORY_CARE_PROVIDER_SITE_OTHER): Payer: Self-pay | Admitting: Family Medicine

## 2023-05-05 DIAGNOSIS — E559 Vitamin D deficiency, unspecified: Secondary | ICD-10-CM

## 2023-05-14 ENCOUNTER — Ambulatory Visit (INDEPENDENT_AMBULATORY_CARE_PROVIDER_SITE_OTHER): Payer: Medicaid Other | Admitting: Family Medicine

## 2023-05-14 ENCOUNTER — Encounter (INDEPENDENT_AMBULATORY_CARE_PROVIDER_SITE_OTHER): Payer: Self-pay | Admitting: Family Medicine

## 2023-05-14 VITALS — BP 106/68 | HR 81 | Temp 98.1°F | Ht 66.0 in | Wt 208.0 lb

## 2023-05-14 DIAGNOSIS — F419 Anxiety disorder, unspecified: Secondary | ICD-10-CM

## 2023-05-14 DIAGNOSIS — Z6833 Body mass index (BMI) 33.0-33.9, adult: Secondary | ICD-10-CM

## 2023-05-14 DIAGNOSIS — E88819 Insulin resistance, unspecified: Secondary | ICD-10-CM | POA: Diagnosis not present

## 2023-05-14 DIAGNOSIS — F32A Depression, unspecified: Secondary | ICD-10-CM | POA: Diagnosis not present

## 2023-05-14 DIAGNOSIS — E669 Obesity, unspecified: Secondary | ICD-10-CM | POA: Diagnosis not present

## 2023-05-14 NOTE — Progress Notes (Signed)
Chief Complaint:   OBESITY Mary [name] is here to discuss her progress with her obesity treatment plan along with follow-up of her obesity related diagnoses. Mary [name] is on the Category 2 Plan + 100 calories and states she is following her eating plan approximately 50% of the time. Mary [name] states she is walking for 30 minutes 3-4 times per week.  Today's visit was #: 4 Starting weight: 215 lbs Starting date: 01/28/2023 Today's weight: 208 lbs Today's date: 05/14/2023 Total lbs lost to date: 7 Total lbs lost since last in-office visit: 3  Interim History: Patient voices that she is still confused about her relationship with her boyfriend and the situation between his mother and her.  She has been going to work and trying to be mindful of food choices.  She has been eating some of the microwave meals and eating out.  She has been walking more frequently as well.  She did not have the eating out handout.    Subjective:   1. Insulin resistance Patient's last A1c was 5.2 and insulin 15.2.  She notes carbohydrate indulgences when eating out and drinking sugar sweetened beverages.  2. Anxiety and depression Patient denies suicidal or homicidal ideations.  She notes panic attack recently.  She is on Lexapro 10 mg.  Assessment/Plan:   1. Insulin resistance We will repeat labs in 2 to 3 months.  Patient was instructed to decrease sugar sweetened beverages, and eating out handout was given with instructions on how to make more mindful choices.  2. Anxiety and depression Patient agreed to increase Lexapro to 15 mg daily (patient does not need a refill).  3. BMI 33.0-33.9,adult  4. Obesity with starting BMI of 34.8 Mary [name] is currently in the action stage of change. As such, her goal is to continue with weight loss efforts. She has agreed to the Category 2 Plan.   Exercise goals: All adults should avoid inactivity. Some physical activity is better than none, and adults who participate in any  amount of physical activity gain some health benefits.  Behavioral modification strategies: increasing lean protein intake, decreasing eating out, meal planning and cooking strategies, and planning for success.  Mary [name] has agreed to follow-up with our clinic in 5 weeks. She was informed of the importance of frequent follow-up visits to maximize her success with intensive lifestyle modifications for her multiple health conditions.   Objective:   Blood pressure 106/68, pulse 81, temperature 98.1 F (36.7 C), height 5\' 6"  (1.676 m), weight 208 lb (94.3 kg), SpO2 98%, unknown if currently breastfeeding. Body mass index is 33.57 kg/m.  General: Cooperative, alert, well developed, in no acute distress. HEENT: Conjunctivae and lids unremarkable. Cardiovascular: Regular rhythm.  Lungs: Normal work of breathing. Neurologic: No focal deficits.   Lab Results  Component Value Date   CREATININE 0.68 01/28/2023   BUN 13 01/28/2023   NA 142 01/28/2023   K 3.7 01/28/2023   CL 106 01/28/2023   CO2 19 (L) 01/28/2023   Lab Results  Component Value Date   ALT 8 01/28/2023   AST 17 01/28/2023   ALKPHOS 89 01/28/2023   BILITOT 0.5 01/28/2023   Lab Results  Component Value Date   HGBA1C 5.2 01/28/2023   HGBA1C 5.0 07/24/2022   Lab Results  Component Value Date   INSULIN 15.2 01/28/2023   INSULIN 17.5 07/24/2022   Lab Results  Component Value Date   TSH 2.490 01/28/2023   Lab Results  Component Value Date   CHOL 166  01/28/2023   HDL 31 (L) 01/28/2023   LDLCALC 109 (H) 01/28/2023   TRIG 144 01/28/2023   Lab Results  Component Value Date   VD25OH 11.9 (L) 01/28/2023   Lab Results  Component Value Date   WBC 6.7 01/28/2023   HGB 12.3 01/28/2023   HCT 37.7 01/28/2023   MCV 84 01/28/2023   PLT 256 01/28/2023   No results found for: "IRON", "TIBC", "FERRITIN"  Attestation Statements:   Reviewed by clinician on day of visit: allergies, medications, problem list, medical  history, surgical history, family history, social history, and previous encounter notes.   I, Burt Knack, am acting as transcriptionist for Reuben Likes, MD.  I have reviewed the above documentation for accuracy and completeness, and I agree with the above. - Reuben Likes, MD

## 2023-05-28 MED ORDER — ESCITALOPRAM OXALATE 10 MG PO TABS
15.0000 mg | ORAL_TABLET | Freq: Every day | ORAL | Status: DC
Start: 2023-05-28 — End: 2023-07-16

## 2023-06-22 ENCOUNTER — Encounter (INDEPENDENT_AMBULATORY_CARE_PROVIDER_SITE_OTHER): Payer: Self-pay | Admitting: Family Medicine

## 2023-06-22 ENCOUNTER — Ambulatory Visit (INDEPENDENT_AMBULATORY_CARE_PROVIDER_SITE_OTHER): Payer: Medicaid Other | Admitting: Family Medicine

## 2023-06-22 VITALS — BP 92/61 | HR 87 | Temp 98.2°F | Ht 66.0 in | Wt 209.0 lb

## 2023-06-22 DIAGNOSIS — E669 Obesity, unspecified: Secondary | ICD-10-CM

## 2023-06-22 DIAGNOSIS — E66811 Obesity, class 1: Secondary | ICD-10-CM

## 2023-06-22 DIAGNOSIS — I10 Essential (primary) hypertension: Secondary | ICD-10-CM

## 2023-06-22 DIAGNOSIS — Z6833 Body mass index (BMI) 33.0-33.9, adult: Secondary | ICD-10-CM | POA: Diagnosis not present

## 2023-06-22 DIAGNOSIS — E559 Vitamin D deficiency, unspecified: Secondary | ICD-10-CM | POA: Diagnosis not present

## 2023-06-22 MED ORDER — VITAMIN D (ERGOCALCIFEROL) 1.25 MG (50000 UNIT) PO CAPS
50000.0000 [IU] | ORAL_CAPSULE | ORAL | 0 refills | Status: DC
Start: 2023-06-22 — End: 2023-07-16

## 2023-06-22 NOTE — Progress Notes (Signed)
Chief Complaint:   OBESITY Mary [name] is here to discuss her progress with her obesity treatment plan along with follow-up of her obesity related diagnoses. Mary [name] is on the Category 2 Plan and states she is following her eating plan approximately 40% of the time. Mary [name] states she is doing 0 minutes 0 times per week.  Today's visit was #: 5 Starting weight: 215 lbs Starting date: 01/28/2023 Today's weight: 209 lbs Today's date: 06/22/2023 Total lbs lost to date: 6 Total lbs lost since last in-office visit: 0  Interim History: Patient here for follow up and brought her daughter Mary [name] with her.  Patient is currently looking for a second job and working to get out of her mother's house.  She is trying to be mindful of food choices but falls into starving herself then having no control of food choices when she finally eats.  She is wondering if she is perhaps depressed.  She is eating at work and getting some protein in and isn't eating fries but is eating cookies and chocolate bars.  She has quite a bit of stress with her significant other and at work.  She may be interested in exploring anti obesity medications.   Subjective:   1. Vitamin D deficiency Patient is on prescription vitamin D.  She denies nausea, vomiting, or muscle weakness but notes fatigue.  2. Essential hypertension Patient's blood pressure is controlled today.  She denies chest pain, chest pressure, or headache.  Assessment/Plan:   1. Vitamin D deficiency We will refill vitamin D 50,000 IU once weekly for 1 month.  We will repeat labs in October.  - Vitamin D, Ergocalciferol, (DRISDOL) 1.25 MG (50000 UNIT) CAPS capsule; Take 1 capsule (50,000 Units total) by mouth every 7 (seven) days.  Dispense: 4 capsule; Refill: 0  2. Essential hypertension Patient agreed to discontinue amlodipine.  We will follow-up at her next appointment.  3. BMI 33.0-33.9,adult  4. Obesity with starting BMI of 34.8 Mary [name] is currently  in the action stage of change. As such, her goal is to continue with weight loss efforts. She has agreed to the Category 2 Plan.   Exercise goals: All adults should avoid inactivity. Some physical activity is better than none, and adults who participate in any amount of physical activity gain some health benefits.  Behavioral modification strategies: increasing lean protein intake, meal planning and cooking strategies, keeping healthy foods in the home, and planning for success.  Mary [name] has agreed to follow-up with our clinic in 3 weeks. She was informed of the importance of frequent follow-up visits to maximize her success with intensive lifestyle modifications for her multiple health conditions.   Objective:   Blood pressure 92/61, pulse 87, temperature 98.2 F (36.8 C), height 5\' 6"  (1.676 m), weight 209 lb (94.8 kg), SpO2 99%, unknown if currently breastfeeding. Body mass index is 33.73 kg/m.  General: Cooperative, alert, well developed, in no acute distress. HEENT: Conjunctivae and lids unremarkable. Cardiovascular: Regular rhythm.  Lungs: Normal work of breathing. Neurologic: No focal deficits.   Lab Results  Component Value Date   CREATININE 0.68 01/28/2023   BUN 13 01/28/2023   NA 142 01/28/2023   K 3.7 01/28/2023   CL 106 01/28/2023   CO2 19 (L) 01/28/2023   Lab Results  Component Value Date   ALT 8 01/28/2023   AST 17 01/28/2023   ALKPHOS 89 01/28/2023   BILITOT 0.5 01/28/2023   Lab Results  Component Value Date   HGBA1C 5.2  01/28/2023   HGBA1C 5.0 07/24/2022   Lab Results  Component Value Date   INSULIN 15.2 01/28/2023   INSULIN 17.5 07/24/2022   Lab Results  Component Value Date   TSH 2.490 01/28/2023   Lab Results  Component Value Date   CHOL 166 01/28/2023   HDL 31 (L) 01/28/2023   LDLCALC 109 (H) 01/28/2023   TRIG 144 01/28/2023   Lab Results  Component Value Date   VD25OH 11.9 (L) 01/28/2023   Lab Results  Component Value Date   WBC 6.7  01/28/2023   HGB 12.3 01/28/2023   HCT 37.7 01/28/2023   MCV 84 01/28/2023   PLT 256 01/28/2023   No results found for: "IRON", "TIBC", "FERRITIN"  Attestation Statements:   Reviewed by clinician on day of visit: allergies, medications, problem list, medical history, surgical history, family history, social history, and previous encounter notes.   I, Burt Knack, am acting as transcriptionist for Reuben Likes, MD.  I have reviewed the above documentation for accuracy and completeness, and I agree with the above. - Reuben Likes, MD

## 2023-07-16 ENCOUNTER — Ambulatory Visit (INDEPENDENT_AMBULATORY_CARE_PROVIDER_SITE_OTHER): Payer: Medicaid Other | Admitting: Family Medicine

## 2023-07-16 ENCOUNTER — Encounter (INDEPENDENT_AMBULATORY_CARE_PROVIDER_SITE_OTHER): Payer: Self-pay | Admitting: Family Medicine

## 2023-07-16 VITALS — BP 114/77 | HR 105 | Temp 98.2°F | Ht 66.0 in | Wt 211.0 lb

## 2023-07-16 DIAGNOSIS — F419 Anxiety disorder, unspecified: Secondary | ICD-10-CM | POA: Diagnosis not present

## 2023-07-16 DIAGNOSIS — F32A Depression, unspecified: Secondary | ICD-10-CM

## 2023-07-16 DIAGNOSIS — E669 Obesity, unspecified: Secondary | ICD-10-CM | POA: Diagnosis not present

## 2023-07-16 DIAGNOSIS — Z6834 Body mass index (BMI) 34.0-34.9, adult: Secondary | ICD-10-CM

## 2023-07-16 DIAGNOSIS — E559 Vitamin D deficiency, unspecified: Secondary | ICD-10-CM

## 2023-07-16 DIAGNOSIS — E66811 Obesity, class 1: Secondary | ICD-10-CM

## 2023-07-16 MED ORDER — ESCITALOPRAM OXALATE 10 MG PO TABS
15.0000 mg | ORAL_TABLET | Freq: Every day | ORAL | 0 refills | Status: DC
Start: 2023-07-16 — End: 2023-08-18

## 2023-07-16 MED ORDER — VITAMIN D (ERGOCALCIFEROL) 1.25 MG (50000 UNIT) PO CAPS
50000.0000 [IU] | ORAL_CAPSULE | ORAL | 0 refills | Status: DC
Start: 2023-07-16 — End: 2023-08-18

## 2023-07-16 NOTE — Progress Notes (Unsigned)
Chief Complaint:   OBESITY Mary [name] is here to discuss her progress with her obesity treatment plan along with follow-up of her obesity related diagnoses. Mary [name] is on the Category 2 Plan and states she is following her eating plan approximately 50% of the time. Mary [name] states she is doing 0 minutes 0 times per week.  Today's visit was #: 6 Starting weight: 215 lbs Starting date: 01/28/2023 Today's weight: 211 lbs Today's date: 07/16/2023 Total lbs lost to date: 4 Total lbs lost since last in-office visit: 0  Interim History: The goal patient has for herself is to start walking more, particularly while at work.  She is sometimes eating a meal replacement bar when she doesn't feel like eating a full meal. She thinks her stress is getting to her in relation to her boyfriend's mother.  She thinks she and two of her kids will likely go to a few trunk or treats for Halloween.    Subjective:   1. Vitamin D deficiency Patient denies nausea, vomiting, or muscle weakness but notes fatigue.  She is on prescription vitamin D.  2. Anxiety and depression Patient is on Lexapro 15 mg daily, and she denies suicidal or homicidal ideations.  Assessment/Plan:   1. Vitamin D deficiency We will refill vitamin D 50,000 IU once weekly for 1 month.  - Vitamin D, Ergocalciferol, (DRISDOL) 1.25 MG (50000 UNIT) CAPS capsule; Take 1 capsule (50,000 Units total) by mouth every 7 (seven) days.  Dispense: 4 capsule; Refill: 0  2. Anxiety and depression We will refill Lexapro 15 mg once daily for 90 days.  - escitalopram (LEXAPRO) 10 MG tablet; Take 1.5 tablets (15 mg total) by mouth daily.  Dispense: 135 tablet; Refill: 0  3. BMI 34.0-34.9,adult  4. Obesity with starting BMI of 34.8 Mary [name] is currently in the action stage of change. As such, her goal is to continue with weight loss efforts. She has agreed to keeping a food journal and adhering to recommended goals of 1250-1400 calories and 85+ grams of  protein daily.   Exercise goals: No exercise has been prescribed at this time.  Behavioral modification strategies: increasing lean protein intake, keeping healthy foods in the home, planning for success, and keeping a strict food journal.  Mary [name] has agreed to follow-up with our clinic in 3 to 4 weeks. She was informed of the importance of frequent follow-up visits to maximize her success with intensive lifestyle modifications for her multiple health conditions.   Objective:   Blood pressure 114/77, pulse (!) 105, temperature 98.2 F (36.8 C), height 5\' 6"  (1.676 m), weight 211 lb (95.7 kg), SpO2 99%, unknown if currently breastfeeding. Body mass index is 34.06 kg/m.  General: Cooperative, alert, well developed, in no acute distress. HEENT: Conjunctivae and lids unremarkable. Cardiovascular: Regular rhythm.  Lungs: Normal work of breathing. Neurologic: No focal deficits.   Lab Results  Component Value Date   CREATININE 0.68 01/28/2023   BUN 13 01/28/2023   NA 142 01/28/2023   K 3.7 01/28/2023   CL 106 01/28/2023   CO2 19 (L) 01/28/2023   Lab Results  Component Value Date   ALT 8 01/28/2023   AST 17 01/28/2023   ALKPHOS 89 01/28/2023   BILITOT 0.5 01/28/2023   Lab Results  Component Value Date   HGBA1C 5.2 01/28/2023   HGBA1C 5.0 07/24/2022   Lab Results  Component Value Date   INSULIN 15.2 01/28/2023   INSULIN 17.5 07/24/2022   Lab Results  Component Value  Date   TSH 2.490 01/28/2023   Lab Results  Component Value Date   CHOL 166 01/28/2023   HDL 31 (L) 01/28/2023   LDLCALC 109 (H) 01/28/2023   TRIG 144 01/28/2023   Lab Results  Component Value Date   VD25OH 11.9 (L) 01/28/2023   Lab Results  Component Value Date   WBC 6.7 01/28/2023   HGB 12.3 01/28/2023   HCT 37.7 01/28/2023   MCV 84 01/28/2023   PLT 256 01/28/2023   No results found for: "IRON", "TIBC", "FERRITIN"  Attestation Statements:   Reviewed by clinician on day of visit:  allergies, medications, problem list, medical history, surgical history, family history, social history, and previous encounter notes.   I, Burt Knack, am acting as transcriptionist for Reuben Likes, MD.  I have reviewed the above documentation for accuracy and completeness, and I agree with the above. - Reuben Likes, MD

## 2023-08-18 ENCOUNTER — Ambulatory Visit (INDEPENDENT_AMBULATORY_CARE_PROVIDER_SITE_OTHER): Payer: Medicaid Other | Admitting: Family Medicine

## 2023-08-18 ENCOUNTER — Encounter (INDEPENDENT_AMBULATORY_CARE_PROVIDER_SITE_OTHER): Payer: Self-pay | Admitting: Family Medicine

## 2023-08-18 VITALS — BP 96/58 | HR 71 | Temp 97.9°F | Ht 66.0 in | Wt 209.0 lb

## 2023-08-18 DIAGNOSIS — F419 Anxiety disorder, unspecified: Secondary | ICD-10-CM

## 2023-08-18 DIAGNOSIS — Z6834 Body mass index (BMI) 34.0-34.9, adult: Secondary | ICD-10-CM

## 2023-08-18 DIAGNOSIS — F32A Depression, unspecified: Secondary | ICD-10-CM | POA: Diagnosis not present

## 2023-08-18 DIAGNOSIS — E66811 Obesity, class 1: Secondary | ICD-10-CM

## 2023-08-18 DIAGNOSIS — E559 Vitamin D deficiency, unspecified: Secondary | ICD-10-CM | POA: Diagnosis not present

## 2023-08-18 DIAGNOSIS — E669 Obesity, unspecified: Secondary | ICD-10-CM | POA: Diagnosis not present

## 2023-08-18 MED ORDER — VITAMIN D (ERGOCALCIFEROL) 1.25 MG (50000 UNIT) PO CAPS: 50000.0000 [IU] | ORAL_CAPSULE | ORAL | 0 refills | Status: DC

## 2023-08-18 MED ORDER — ESCITALOPRAM OXALATE 10 MG PO TABS: 15.0000 mg | ORAL_TABLET | Freq: Every day | ORAL | 0 refills | Status: DC

## 2023-08-18 NOTE — Progress Notes (Signed)
SUBJECTIVE:  Chief Complaint: Obesity  Interim History: Patient not sure what her plans are for Thanksgiving; thinks she will be going to her friends house.  She is noticing in her logging that she eat chicken tenders at work a lot.  She is eating out at a [place] or chili's.  She is only eating on the weekends now.  She will occasionally cook at home.  Her boyfriend is cooking and she is picking it up.  She has not weighed any of the meat protein. She is using a food logging app but is inconsistent.  She is not always getting all her nutrition in.  She is enjoying the kind bars intermittently.  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   Mary [name] is here to discuss her progress with her obesity treatment plan. She is on the keeping a food journal and adhering to recommended goals of 1200-1400 calories and 85+ grams of protein and states she is following her eating plan approximately 20 % of the time. She states she is exercising 10 minutes 2  times per week.   OBJECTIVE: Visit Diagnoses: Problem List Items Addressed This Visit       Other   Anxiety and depression - Primary    Patient is currently on Lexapro.  She feels her symptoms are well managed at current medication and dose.  She denies suicidal and homicidal ideation.  She needs a refill today; refill sent in.      Relevant Medications   escitalopram (LEXAPRO) 10 MG tablet   Vitamin D deficiency    Discussed importance of vitamin d supplementation.  Vitamin d supplementation has been shown to decrease fatigue, decrease risk of progression to insulin resistance and then prediabetes, decreases risk of falling in older age and can even assist in decreasing depressive symptoms in PTSD.   Refill for Vitamin D sent in.  Repeat labs at next appointment or with PCP.       Relevant Medications   Vitamin D, Ergocalciferol, (DRISDOL) 1.25 MG (50000 UNIT) CAPS capsule     Vitals Temp: 97.9 F (36.6 C) BP: (!) 96/58 Pulse Rate: 71 SpO2: 99 %   Anthropometric Measurements Height: 5\' 6"  (1.676 m) Weight: 209 lb (94.8 kg) BMI (Calculated): 33.75 Weight at Last Visit: 211 lb Weight Lost Since Last Visit: 2 Weight Gained Since Last Visit: 0 Starting Weight: 215 lb   Body Composition  Body Fat %: 39.4 % Fat Mass (lbs): 82.4 lbs Muscle Mass (lbs): 120.4 lbs Total Body Water (lbs): 84.6 lbs Visceral Fat Rating : 7   Other Clinical Data Today's Visit #: 7 Starting Date: 01/28/23     ASSESSMENT AND PLAN:  Diet: Mary [name] is currently in the action stage of change. As such, her goal is to continue with weight loss efforts. She has agreed to keeping a food journal and adhering to recommended goals of 1250-1400 calories and 85 or more grams of protein a day.  Exercise: Mary [name] has been instructed that some exercise is better than none for weight loss and overall health benefits.   Behavior Modification:  We discussed the following Behavioral Modification Strategies today: increasing lean protein intake, increasing vegetables, no skipping meals, meal planning and cooking strategies, and keeping healthy foods in the home.  Return in about 3 weeks (around 09/08/2023) for fasting labs.. She was informed of the importance of frequent follow up visits to maximize her success with  intensive lifestyle modifications for her multiple health conditions.  Attestation Statements:   Reviewed by clinician on day of visit: allergies, medications, problem list, medical history, surgical history, family history, social history, and previous encounter notes.   Reuben Likes, MD

## 2023-08-18 NOTE — Assessment & Plan Note (Signed)
Patient is currently on Lexapro.  She feels her symptoms are well managed at current medication and dose.  She denies suicidal and homicidal ideation.  She needs a refill today; refill sent in.

## 2023-08-18 NOTE — Assessment & Plan Note (Addendum)
Discussed importance of vitamin d supplementation.  Vitamin d supplementation has been shown to decrease fatigue, decrease risk of progression to insulin resistance and then prediabetes, decreases risk of falling in older age and can even assist in decreasing depressive symptoms in PTSD.   Refill for Vitamin D sent in.  Repeat labs at next appointment or with PCP.

## 2023-08-28 ENCOUNTER — Other Ambulatory Visit (INDEPENDENT_AMBULATORY_CARE_PROVIDER_SITE_OTHER): Payer: Self-pay | Admitting: Family Medicine

## 2023-08-28 DIAGNOSIS — E559 Vitamin D deficiency, unspecified: Secondary | ICD-10-CM

## 2023-09-09 ENCOUNTER — Ambulatory Visit (INDEPENDENT_AMBULATORY_CARE_PROVIDER_SITE_OTHER): Payer: Medicaid Other | Admitting: Family Medicine

## 2023-09-09 VITALS — BP 96/67 | HR 86 | Temp 98.1°F | Ht 66.0 in | Wt 207.0 lb

## 2023-09-09 DIAGNOSIS — E559 Vitamin D deficiency, unspecified: Secondary | ICD-10-CM

## 2023-09-09 DIAGNOSIS — Z6833 Body mass index (BMI) 33.0-33.9, adult: Secondary | ICD-10-CM | POA: Diagnosis not present

## 2023-09-09 DIAGNOSIS — E7800 Pure hypercholesterolemia, unspecified: Secondary | ICD-10-CM

## 2023-09-09 DIAGNOSIS — E785 Hyperlipidemia, unspecified: Secondary | ICD-10-CM

## 2023-09-09 DIAGNOSIS — Z6834 Body mass index (BMI) 34.0-34.9, adult: Secondary | ICD-10-CM

## 2023-09-09 DIAGNOSIS — E669 Obesity, unspecified: Secondary | ICD-10-CM | POA: Diagnosis not present

## 2023-09-09 MED ORDER — VITAMIN D (ERGOCALCIFEROL) 1.25 MG (50000 UNIT) PO CAPS: 50000.0000 [IU] | ORAL_CAPSULE | ORAL | 0 refills | Status: DC

## 2023-09-09 NOTE — Assessment & Plan Note (Signed)
Last LDL slightly elevated at 109 and HDL low at 31.  No medication necessary based on levels.  Repeat fasting labs at next appointment.

## 2023-09-09 NOTE — Progress Notes (Signed)
   SUBJECTIVE:  Chief Complaint: Obesity  Interim History: Patient is not planning on much for the holiday.  She is waiting for her new job to verify her background check.  She is planning to apply for some income based housing. She recently broke up with her boyfriend.  Since last appointment she has been trying to be more mindful of food choices and incorporating protein bars and chips if she isn't getting a full meal in.  When eating out she isn't eating all the meal she is getting. She is planning to go to trampoline park, skating and [employer] when her kids are out of school coming up.  Mary [name] is here to discuss her progress with her obesity treatment plan. She is on the keeping a food journal and adhering to recommended goals of 1250-1400 calories and 85 grams of protein and states she is following her eating plan approximately 50 % of the time. She states she is not exercising.  OBJECTIVE: Visit Diagnoses: Problem List Items Addressed This Visit       Other   Vitamin D deficiency - Primary   Last vitamin D level was significantly below goal.  She needs a refill today.  No nausea, vomiting or muscle weakness.  No upcoming appointment with PCP.      Relevant Medications   Vitamin D, Ergocalciferol, (DRISDOL) 1.25 MG (50000 UNIT) CAPS capsule   HLD (hyperlipidemia)   Last LDL slightly elevated at 109 and HDL low at 31.  No medication necessary based on levels.  Repeat fasting labs at next appointment.       Vitals Temp: 98.1 F (36.7 C) BP: 96/67 Pulse Rate: 86 SpO2: 99 %   Anthropometric Measurements Height: 5\' 6"  (1.676 m) Weight: 207 lb (93.9 kg) BMI (Calculated): 33.43 Weight at Last Visit: 209 lb Weight Lost Since Last Visit: 2 Weight Gained Since Last Visit: 0 Starting Weight: 215 lb Total Weight Loss (lbs): 8 lb (3.629 kg)   Body Composition  Body Fat %: 39.4 % Fat Mass (lbs): 81.6 lbs Muscle Mass (lbs): 119.2 lbs Total Body Water (lbs): 84.4  lbs Visceral Fat Rating : 7   Other Clinical Data Today's Visit #: 8 Starting Date: 01/28/23     ASSESSMENT AND PLAN:  Diet: Diamond is currently in the action stage of change. As such, her goal is to continue with weight loss efforts. She has agreed to keeping a food journal and adhering to recommended goals of 1250-1400 calories and 85 or more grams of protein daily.  Exercise: [name] has been instructed that some exercise is better than none for weight loss and overall health benefits.   Behavior Modification:  We discussed the following Behavioral Modification Strategies today: increasing lean protein intake, increasing vegetables, meal planning and cooking strategies, keeping healthy foods in the home, and keep a strict food journal.   No follow-ups on file.Marland Kitchen She was informed of the importance of frequent follow up visits to maximize her success with intensive lifestyle modifications for her multiple health conditions.  Attestation Statements:   Reviewed by clinician on day of visit: allergies, medications, problem list, medical history, surgical history, family history, social history, and previous encounter notes.    Reuben Likes, MD

## 2023-09-09 NOTE — Assessment & Plan Note (Signed)
Last vitamin D level was significantly below goal.  She needs a refill today.  No nausea, vomiting or muscle weakness.  No upcoming appointment with PCP.

## 2023-10-19 ENCOUNTER — Ambulatory Visit (INDEPENDENT_AMBULATORY_CARE_PROVIDER_SITE_OTHER): Payer: Medicaid Other | Admitting: Family Medicine

## 2023-11-03 ENCOUNTER — Encounter (INDEPENDENT_AMBULATORY_CARE_PROVIDER_SITE_OTHER): Payer: Self-pay | Admitting: Family Medicine

## 2023-11-03 ENCOUNTER — Ambulatory Visit (INDEPENDENT_AMBULATORY_CARE_PROVIDER_SITE_OTHER): Payer: Medicaid Other | Admitting: Family Medicine

## 2023-11-03 VITALS — BP 103/61 | HR 91 | Temp 97.6°F | Ht 66.0 in | Wt 205.0 lb

## 2023-11-03 DIAGNOSIS — E7800 Pure hypercholesterolemia, unspecified: Secondary | ICD-10-CM

## 2023-11-03 DIAGNOSIS — Z6833 Body mass index (BMI) 33.0-33.9, adult: Secondary | ICD-10-CM

## 2023-11-03 DIAGNOSIS — E785 Hyperlipidemia, unspecified: Secondary | ICD-10-CM | POA: Diagnosis not present

## 2023-11-03 DIAGNOSIS — E88819 Insulin resistance, unspecified: Secondary | ICD-10-CM

## 2023-11-03 DIAGNOSIS — E559 Vitamin D deficiency, unspecified: Secondary | ICD-10-CM | POA: Diagnosis not present

## 2023-11-03 DIAGNOSIS — E669 Obesity, unspecified: Secondary | ICD-10-CM

## 2023-11-03 DIAGNOSIS — E66811 Obesity, class 1: Secondary | ICD-10-CM

## 2023-11-03 NOTE — Assessment & Plan Note (Signed)
Last vitamin D level done in May 2024 significantly low at 11.  Patient has been on prescription strength vitamin D supplementation since that time.  She reports an improvement in her energy levels.  Repeat vitamin D level done today.  Pending results may need to change formulation if she is not a metabolizer of D3.

## 2023-11-03 NOTE — Progress Notes (Signed)
SUBJECTIVE:  Chief Complaint: Obesity  Interim History: Patient had a good holiday season- She stayed local for the holidays.  The month of January was alright.  She is dealing with a few issues with her job.  She is currently looking for another job- supposed to have a walk in interview tomorrow.  2 of her kids birthdays were last month and her baby's birthday was the beginning of this month.  She is also prepping stuff for her kids Valentine's Day celebrations.    Mary [name] is here to discuss her progress with her obesity treatment plan. She is on the keeping a food journal and adhering to recommended goals of 1250-1400 calories and 85 or more grams of protein daily and states she is following her eating plan approximately 40 % of the time.  She is ending up around 1000 calories most days and is not sure of protein content.  She states she is not exercising.  OBJECTIVE: Visit Diagnoses: Problem List Items Addressed This Visit       Endocrine   Insulin resistance   Elevated insulin level on last blood work done 9 months ago.  Patient has been trying to limit her simple carbohydrates and decreasing her sugar sweetened beverages.  She is fasting for labs today so we will repeat comprehensive metabolic panel, hemoglobin A1c, and insulin level.  Plan to discuss labs at next appointment.      Relevant Orders   Comprehensive metabolic panel   Hemoglobin A1c   Insulin, random     Other   Vitamin D deficiency - Primary   Last vitamin D level done in May 2024 significantly low at 11.  Patient has been on prescription strength vitamin D supplementation since that time.  She reports an improvement in her energy levels.  Repeat vitamin D level done today.  Pending results may need to change formulation if she is not a metabolizer of D3.      Relevant Orders   VITAMIN D 25 Hydroxy (Vit-D Deficiency, Fractures)   HLD (hyperlipidemia)   Labs done in May 2024 showing an LDL of 109, HDL 31,  triglycerides 144.  Patient has been trying to monitor her dietary intake of saturated fats since that time.  She is logging her daily intake of calories and nutrition.  We will repeat fasting lipid panel today.      Relevant Orders   Lipid Panel With LDL/HDL Ratio    Vitals Temp: 16.1 F (36.4 C) BP: 103/61 Pulse Rate: 91 SpO2: 99 %   Anthropometric Measurements Height: 5\' 6"  (1.676 m) Weight: 205 lb (93 kg) BMI (Calculated): 33.1 Weight at Last Visit: 207 lb Weight Lost Since Last Visit: 2 lb Weight Gained Since Last Visit: 0 Starting Weight: 215 lb Total Weight Loss (lbs): 10 lb (4.536 kg)   Body Composition  Body Fat %: 39.5 % Fat Mass (lbs): 81 lbs Muscle Mass (lbs): 118 lbs Total Body Water (lbs): 83 lbs Visceral Fat Rating : 7   Other Clinical Data Fasting: yes Labs: yes Today's Visit #: 9 Starting Date: 01/28/23     ASSESSMENT AND PLAN:  Diet: [name] is currently in the action stage of change. As such, her goal is to continue with weight loss efforts. She has agreed to keeping a food journal and adhering to recommended goals of 1250-1350 calories and 85 or more grams protein daily.  Exercise: [name] has been instructed that some exercise is better than none for weight loss and overall health  benefits.   Behavior Modification:  We discussed the following Behavioral Modification Strategies today: increasing lean protein intake, increasing vegetables, meal planning and cooking strategies, and keep a strict food journal. We discussed various medication options to help [name] with her weight loss efforts and we both agreed to discuss initiation of Wegovy at next appointment pending patient's nutritional intake is closer to her goal calorie and protein..  No follow-ups on file.Marland Kitchen She was informed of the importance of frequent follow up visits to maximize her success with intensive lifestyle modifications for her multiple health conditions.  Attestation  Statements:   Reviewed by clinician on day of visit: allergies, medications, problem list, medical history, surgical history, family history, social history, and previous encounter notes.      Reuben Likes MD

## 2023-11-03 NOTE — Assessment & Plan Note (Signed)
Labs done in May 2024 showing an LDL of 109, HDL 31, triglycerides 144.  Patient has been trying to monitor her dietary intake of saturated fats since that time.  She is logging her daily intake of calories and nutrition.  We will repeat fasting lipid panel today.

## 2023-11-03 NOTE — Assessment & Plan Note (Signed)
Elevated insulin level on last blood work done 9 months ago.  Patient has been trying to limit her simple carbohydrates and decreasing her sugar sweetened beverages.  She is fasting for labs today so we will repeat comprehensive metabolic panel, hemoglobin A1c, and insulin level.  Plan to discuss labs at next appointment.

## 2023-11-04 LAB — LIPID PANEL WITH LDL/HDL RATIO
Cholesterol, Total: 157 mg/dL (ref 100–199)
HDL: 30 mg/dL — ABNORMAL LOW (ref >39)
LDL Chol Calc (NIH): 111 mg/dL — ABNORMAL HIGH (ref 0–99)
LDL/HDL Ratio: 3.7 {ratio} — ABNORMAL HIGH (ref 0.0–3.2)
Triglycerides: 82 mg/dL (ref 0–149)
VLDL Cholesterol Cal: 16 mg/dL (ref 5–40)

## 2023-11-04 LAB — COMPREHENSIVE METABOLIC PANEL
ALT: 8 [IU]/L (ref 0–32)
AST: 14 [IU]/L (ref 0–40)
Albumin: 4.4 g/dL (ref 4.0–5.0)
Alkaline Phosphatase: 91 [IU]/L (ref 44–121)
BUN/Creatinine Ratio: 16 (ref 9–23)
BUN: 12 mg/dL (ref 6–20)
Bilirubin Total: 0.5 mg/dL (ref 0.0–1.2)
CO2: 22 mmol/L (ref 20–29)
Calcium: 8.9 mg/dL (ref 8.7–10.2)
Chloride: 106 mmol/L (ref 96–106)
Creatinine, Ser: 0.74 mg/dL (ref 0.57–1.00)
Globulin, Total: 2 g/dL (ref 1.5–4.5)
Glucose: 80 mg/dL (ref 70–99)
Potassium: 4.1 mmol/L (ref 3.5–5.2)
Sodium: 142 mmol/L (ref 134–144)
Total Protein: 6.4 g/dL (ref 6.0–8.5)
eGFR: 113 mL/min/{1.73_m2} (ref >59)

## 2023-11-04 LAB — VITAMIN D 25 HYDROXY (VIT D DEFICIENCY, FRACTURES): Vit D, 25-Hydroxy: 29.6 ng/mL — ABNORMAL LOW (ref 30.0–100.0)

## 2023-11-04 LAB — INSULIN, RANDOM: INSULIN: 16.3 u[IU]/mL (ref 2.6–24.9)

## 2023-11-04 LAB — HEMOGLOBIN A1C
Est. average glucose Bld gHb Est-mCnc: 100 mg/dL
Hgb A1c MFr Bld: 5.1 % (ref 4.8–5.6)

## 2023-12-01 ENCOUNTER — Ambulatory Visit (INDEPENDENT_AMBULATORY_CARE_PROVIDER_SITE_OTHER): Payer: Medicaid Other | Admitting: Family Medicine

## 2023-12-01 VITALS — BP 107/74 | HR 75 | Temp 97.5°F | Ht 66.0 in | Wt 208.0 lb

## 2023-12-01 DIAGNOSIS — E785 Hyperlipidemia, unspecified: Secondary | ICD-10-CM

## 2023-12-01 DIAGNOSIS — F419 Anxiety disorder, unspecified: Secondary | ICD-10-CM | POA: Diagnosis not present

## 2023-12-01 DIAGNOSIS — E559 Vitamin D deficiency, unspecified: Secondary | ICD-10-CM | POA: Diagnosis not present

## 2023-12-01 DIAGNOSIS — E66811 Obesity, class 1: Secondary | ICD-10-CM

## 2023-12-01 DIAGNOSIS — F32A Depression, unspecified: Secondary | ICD-10-CM | POA: Diagnosis not present

## 2023-12-01 DIAGNOSIS — Z6833 Body mass index (BMI) 33.0-33.9, adult: Secondary | ICD-10-CM

## 2023-12-01 DIAGNOSIS — E7800 Pure hypercholesterolemia, unspecified: Secondary | ICD-10-CM | POA: Diagnosis not present

## 2023-12-01 MED ORDER — VITAMIN D (ERGOCALCIFEROL) 1.25 MG (50000 UNIT) PO CAPS: 50000.0000 [IU] | ORAL_CAPSULE | ORAL | 0 refills | Status: DC

## 2023-12-01 MED ORDER — ESCITALOPRAM OXALATE 10 MG PO TABS: 15.0000 mg | ORAL_TABLET | Freq: Every day | ORAL | 0 refills | Status: DC

## 2023-12-01 NOTE — Progress Notes (Signed)
 SUBJECTIVE:  Chief Complaint: Obesity  Interim History: Patient feeling overwhelmed today.  She mentions since last appointment she hasn't been doing much- going to job interviews and spending time living life.  Over the next month she is looking for jobs and looking for a place to live.  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  She realizes she has been going out to eat too much with a friend.  She recognizes she needs to cook at home more.  Mary [name] is here to discuss her progress with her obesity treatment plan. She is on the keeping a food journal and adhering to recommended goals of 1250-1350 calories and 85 grams of protein and states she is following her eating plan approximately 80 % of the time. She states she is walking some.  OBJECTIVE: Visit Diagnoses: Problem List Items Addressed This Visit       Other   Anxiety and depression - Primary   Doing well on Lexapro 15mg  daily.  No suicidal or homicidal ideation.  Needs refill of lexapro today- symptoms improved on current dose.      Relevant Medications   escitalopram (LEXAPRO) 10 MG tablet   Vitamin D deficiency   Last vitamin d level 29.6 which is improved from 11.  Needs refill of prescription strength Vitamin D      Relevant Medications   Vitamin D, Ergocalciferol, (DRISDOL) 1.25 MG (50000 UNIT) CAPS capsule   HLD (hyperlipidemia)   Cholesterol panel from last appointment discussed today- essentially unchanged.  Discussed importance of limiting saturated fats to decrease LDL and increase her activity to increase HDL.      Other Visit Diagnoses       Obesity with starting BMI of 34.8         BMI 33.0-33.9,adult           Vitals Temp: (!) 97.5 F (36.4 C) BP: 107/74 Pulse Rate: 75 SpO2: 98 %   Anthropometric Measurements Height: 5\' 6"  (1.676 m) Weight: 208 lb (94.3 kg) BMI (Calculated): 33.59 Weight at Last Visit: 205 lb Weight Lost Since Last Visit: 0 Weight Gained Since Last  Visit: 3 Starting Weight: 215 lb Total Weight Loss (lbs): 7 lb (3.175 kg)   Body Composition  Body Fat %: 37.8 % Fat Mass (lbs): 79 lbs Muscle Mass (lbs): 123.2 lbs Total Body Water (lbs): 85.6 lbs Visceral Fat Rating : 7   Other Clinical Data Today's Visit #: 10 Starting Date: 01/28/23 Comments: 1250-1350/85     ASSESSMENT AND PLAN:  Diet: Mary [name] is currently in the action stage of change. As such, her goal is to continue with weight loss efforts and has agreed to keeping a food journal and adhering to recommended goals of 1250-1350 calories and 85 or more grams of protein daily.   Exercise:  All adults should avoid inactivity. Some activity is better than none, and adults who participate in any amount of physical activity, gain some health benefits. and For substantial health benefits, adults should do at least 150 minutes (2 hours and 30 minutes) a week of moderate-intensity, or 75 minutes (1 hour and 15 minutes) a week of vigorous-intensity aerobic physical activity, or an equivalent combination of moderate- and vigorous-intensity aerobic activity. Aerobic activity should be performed in episodes of at least 10 minutes, and preferably, it should be spread throughout the week.  Behavior Modification:  We discussed the following Behavioral Modification Strategies today: increasing lean protein intake, increasing vegetables, decreasing eating out,  meal planning and cooking strategies, planning for success, and keep a strict food journal.   No follow-ups on file.Marland Kitchen She was informed of the importance of frequent follow up visits to maximize her success with intensive lifestyle modifications for her multiple health conditions.  Attestation Statements:   Reviewed by clinician on day of visit: allergies, medications, problem list, medical history, surgical history, family history, social history, and previous encounter notes.     Reuben Likes, MD

## 2023-12-01 NOTE — Assessment & Plan Note (Signed)
 Cholesterol panel from last appointment discussed today- essentially unchanged.  Discussed importance of limiting saturated fats to decrease LDL and increase her activity to increase HDL.

## 2023-12-01 NOTE — Assessment & Plan Note (Signed)
 Doing well on Lexapro 15mg  daily.  No suicidal or homicidal ideation.  Needs refill of lexapro today- symptoms improved on current dose.

## 2023-12-01 NOTE — Assessment & Plan Note (Signed)
 Last vitamin d level 29.6 which is improved from 11.  Needs refill of prescription strength Vitamin D

## 2023-12-03 ENCOUNTER — Encounter (INDEPENDENT_AMBULATORY_CARE_PROVIDER_SITE_OTHER): Payer: Self-pay

## 2023-12-29 ENCOUNTER — Ambulatory Visit (INDEPENDENT_AMBULATORY_CARE_PROVIDER_SITE_OTHER): Admitting: Family Medicine

## 2023-12-29 ENCOUNTER — Encounter (INDEPENDENT_AMBULATORY_CARE_PROVIDER_SITE_OTHER): Payer: Self-pay | Admitting: Family Medicine

## 2023-12-29 VITALS — BP 106/65 | HR 90 | Temp 98.0°F | Ht 66.0 in | Wt 216.0 lb

## 2023-12-29 DIAGNOSIS — F32A Depression, unspecified: Secondary | ICD-10-CM | POA: Diagnosis not present

## 2023-12-29 DIAGNOSIS — E66811 Obesity, class 1: Secondary | ICD-10-CM

## 2023-12-29 DIAGNOSIS — F419 Anxiety disorder, unspecified: Secondary | ICD-10-CM | POA: Diagnosis not present

## 2023-12-29 DIAGNOSIS — Z6834 Body mass index (BMI) 34.0-34.9, adult: Secondary | ICD-10-CM

## 2023-12-29 NOTE — Progress Notes (Signed)
 SUBJECTIVE:  Chief Complaint: Obesity  Interim History: patient voices she has not been eating as nutritiously as she knows she needs to.  She has been walking more.  She has been getting in things like burgers, spaghetti, baked macaroni and cheese.  She is getting more unhealthy food which she thinks has an emotional core.  She feels overwhelmed.  She is still on the waiting list for income based housing; some for 5 years.  She is interested in switching roles at the [employer].   Mary [name] is here to discuss her progress with her obesity treatment plan. She is on the keeping a food journal and adhering to recommended goals of 1250-1350 calories and 85 grams of protein and states she is following her eating plan approximately 50 % of the time. She states she is exercising 60 minutes 2 times per week.   OBJECTIVE: Visit Diagnoses: Problem List Items Addressed This Visit       Other   Anxiety and depression - Primary   Patient Is dealing with a significant amount of stress related to work and familial stress.  She is taking her medication but still doing some emotional eating.  She does not need a refill today.      Obesity, Class I, BMI 30-34.9   Anthropometric Measurements Height: 5\' 6"  (1.676 m) Weight: 216 lb (98 kg) BMI (Calculated): 34.88 Weight at Last Visit: 208 lb Weight Lost Since Last Visit: 8 Weight Gained Since Last Visit: 0 Starting Weight: 215 lb Total Weight Loss (lbs): 0 lb (0 kg) Body Composition  Body Fat %: 39.6 % Fat Mass (lbs): 85.6 lbs Muscle Mass (lbs): 123.8 lbs Total Body Water (lbs): 86.2 lbs Visceral Fat Rating : 8 Other Clinical Data Today's Visit #: 11 Starting Date: 01/28/23 Comments: 1250-1350/85       Other Visit Diagnoses       Obesity with starting BMI of 34.8         BMI 34.0-34.9,adult           No data recorded       12/29/2023    4:00 PM 12/01/2023    2:00 PM 11/03/2023   10:00 AM  Vitals with BMI  Height 5\' 6"  5\' 6"  5'  6"  Weight 216 lbs 208 lbs 205 lbs  BMI 34.88 33.59 33.1  Systolic 106 107 161  Diastolic 65 74 61  Pulse 90 75 91      ASSESSMENT AND PLAN:  Diet: Mary [name] is currently in the action stage of change. As such, her goal is to continue with weight loss efforts and has agreed to practicing portion control and making smarter food choices, such as increasing vegetables and decreasing simple carbohydrates.  We discussed emotional eating strategies and coping mechanisms for substitution for the emotional eating.   Exercise:  For substantial health benefits, adults should do at least 150 minutes (2 hours and 30 minutes) a week of moderate-intensity, or 75 minutes (1 hour and 15 minutes) a week of vigorous-intensity aerobic physical activity, or an equivalent combination of moderate- and vigorous-intensity aerobic activity. Aerobic activity should be performed in episodes of at least 10 minutes, and preferably, it should be spread throughout the week.  Behavior Modification:  We discussed the following Behavioral Modification Strategies today: increasing lean protein intake, decreasing simple carbohydrates, meal planning and cooking strategies, avoiding temptations, and planning for success.   Return in about 6 weeks (around 02/09/2024).Aaron Aas She was informed of the importance of frequent follow  up visits to maximize her success with intensive lifestyle modifications for her multiple health conditions.  Attestation Statements:   Reviewed by clinician on day of visit: allergies, medications, problem list, medical history, surgical history, family history, social history, and previous encounter notes.     Donaciano Frizzle, MD

## 2023-12-29 NOTE — Assessment & Plan Note (Signed)
 Patient Is dealing with a significant amount of stress related to work and familial stress.  She is taking her medication but still doing some emotional eating.  She does not need a refill today.

## 2024-01-06 DIAGNOSIS — Z6834 Body mass index (BMI) 34.0-34.9, adult: Secondary | ICD-10-CM | POA: Insufficient documentation

## 2024-01-06 DIAGNOSIS — E66811 Obesity, class 1: Secondary | ICD-10-CM | POA: Insufficient documentation

## 2024-01-06 NOTE — Assessment & Plan Note (Signed)
 Anthropometric Measurements Height: 5\' 6"  (1.676 m) Weight: 216 lb (98 kg) BMI (Calculated): 34.88 Weight at Last Visit: 208 lb Weight Lost Since Last Visit: 8 Weight Gained Since Last Visit: 0 Starting Weight: 215 lb Total Weight Loss (lbs): 0 lb (0 kg) Body Composition  Body Fat %: 39.6 % Fat Mass (lbs): 85.6 lbs Muscle Mass (lbs): 123.8 lbs Total Body Water (lbs): 86.2 lbs Visceral Fat Rating : 8 Other Clinical Data Today's Visit #: 11 Starting Date: 01/28/23 Comments: 1191-4782/95

## 2024-01-10 ENCOUNTER — Other Ambulatory Visit (INDEPENDENT_AMBULATORY_CARE_PROVIDER_SITE_OTHER): Payer: Self-pay | Admitting: Family Medicine

## 2024-01-10 DIAGNOSIS — E559 Vitamin D deficiency, unspecified: Secondary | ICD-10-CM

## 2024-02-02 ENCOUNTER — Ambulatory Visit (INDEPENDENT_AMBULATORY_CARE_PROVIDER_SITE_OTHER): Admitting: Family Medicine

## 2024-03-06 ENCOUNTER — Other Ambulatory Visit (INDEPENDENT_AMBULATORY_CARE_PROVIDER_SITE_OTHER): Payer: Self-pay | Admitting: Family Medicine

## 2024-03-06 DIAGNOSIS — F419 Anxiety disorder, unspecified: Secondary | ICD-10-CM

## 2024-03-14 ENCOUNTER — Encounter (INDEPENDENT_AMBULATORY_CARE_PROVIDER_SITE_OTHER): Payer: Self-pay | Admitting: Family Medicine

## 2024-03-14 ENCOUNTER — Ambulatory Visit (INDEPENDENT_AMBULATORY_CARE_PROVIDER_SITE_OTHER): Admitting: Family Medicine

## 2024-03-14 VITALS — BP 82/50 | HR 76 | Temp 97.9°F | Ht 66.0 in | Wt 211.0 lb

## 2024-03-14 DIAGNOSIS — F419 Anxiety disorder, unspecified: Secondary | ICD-10-CM | POA: Diagnosis not present

## 2024-03-14 DIAGNOSIS — E66811 Obesity, class 1: Secondary | ICD-10-CM

## 2024-03-14 DIAGNOSIS — Z6834 Body mass index (BMI) 34.0-34.9, adult: Secondary | ICD-10-CM

## 2024-03-14 DIAGNOSIS — F32A Depression, unspecified: Secondary | ICD-10-CM | POA: Diagnosis not present

## 2024-03-14 MED ORDER — WEGOVY 0.25 MG/0.5ML ~~LOC~~ SOAJ: 0.2500 mg | SUBCUTANEOUS | 0 refills | Status: DC

## 2024-03-14 MED ORDER — ESCITALOPRAM OXALATE 10 MG PO TABS: 15.0000 mg | ORAL_TABLET | Freq: Every day | ORAL | 0 refills | Status: AC

## 2024-03-14 NOTE — Progress Notes (Signed)
 SUBJECTIVE:  Chief Complaint: Obesity  Interim History: Patient has mostly been living life- getting set up with Section 8 housing and looking for income based housing.  She also celebrated a birthday since last appointment. She has been filling out applications and trying to find another job.  Her car broke down right around Mother's Day. She hasn't been snacking as much at work either. She is trying to get in more protein snacks like Kind Bars and jerky. For her birthday she went out to eat with her kids.  Her goal is go walking twice a week over the next week.   Brittnay is here to discuss her progress with her obesity treatment plan. She is on the practicing portion control and making smarter food choices, such as increasing vegetables and decreasing simple carbohydrates and states she is following her eating plan approximately 50 % of the time. She states she is not exercising.   OBJECTIVE: Visit Diagnoses: Problem List Items Addressed This Visit       Other   Anxiety and depression - Primary   Patient having improved symptoms of anhedonia on lexapro .  Needs a refill today.  No change in dose but will follow up on symptoms at next appointment.  90 rx sent to pharmacy      Relevant Medications   escitalopram  (LEXAPRO ) 10 MG tablet   Class 1 obesity with serious comorbidity and body mass index (BMI) of 34.0 to 34.9 in adult   Anthropometric Measurements Height: 5' 6 (1.676 m) Weight: 211 lb (95.7 kg) BMI (Calculated): 34.07 Weight at Last Visit: 216 lb Weight Lost Since Last Visit: 5 lb Weight Gained Since Last Visit: 0 Starting Weight: 215 lb Total Weight Loss (lbs): 4 lb (1.814 kg) Body Composition  Body Fat %: 39.7 % Fat Mass (lbs): 84 lbs Muscle Mass (lbs): 121.4 lbs Total Body Water (lbs): 84.4 lbs Visceral Fat Rating : 8 Other Clinical Data Fasting: no Labs: no Today's Visit #: 12 Starting Date: 01/28/23       Relevant Medications   Semaglutide -Weight  Management (WEGOVY ) 0.25 MG/0.5ML SOAJ   Other Visit Diagnoses       BMI 34.0-34.9,adult           No data recorded       03/14/2024    2:00 PM 12/29/2023    4:00 PM 12/01/2023    2:00 PM  Vitals with BMI  Height 5' 6 5' 6 5' 6  Weight 211 lbs 216 lbs 208 lbs  BMI 34.07 34.88 33.59  Systolic 82 106 107  Diastolic 50 65 74  Pulse 76 90 75      ASSESSMENT AND PLAN:  Diet: Norvella is currently in the action stage of change. As such, her goal is to continue with weight loss efforts and has agreed to practicing portion control and making smarter food choices, such as increasing vegetables and decreasing simple carbohydrates.  Patient wants to decrease chocolate consumption over the next 4 weeks.  Exercise:  All adults should avoid inactivity. Some activity is better than none, and adults who participate in any amount of physical activity, gain some health benefits.  Patient goal is to start walking 2x a week.  Behavior Modification:  We discussed the following Behavioral Modification Strategies today: increasing lean protein intake, decreasing simple carbohydrates, increasing vegetables, meal planning and cooking strategies, and keeping healthy foods in the home.   Return in about 5 weeks (around 04/18/2024).   She was informed of the importance  of frequent follow up visits to maximize her success with intensive lifestyle modifications for her multiple health conditions.  Attestation Statements:   Reviewed by clinician on day of visit: allergies, medications, problem list, medical history, surgical history, family history, social history, and previous encounter notes.   Adelita Cho, MD

## 2024-03-23 NOTE — Assessment & Plan Note (Signed)
 Anthropometric Measurements Height: 5' 6 (1.676 m) Weight: 211 lb (95.7 kg) BMI (Calculated): 34.07 Weight at Last Visit: 216 lb Weight Lost Since Last Visit: 5 lb Weight Gained Since Last Visit: 0 Starting Weight: 215 lb Total Weight Loss (lbs): 4 lb (1.814 kg) Body Composition  Body Fat %: 39.7 % Fat Mass (lbs): 84 lbs Muscle Mass (lbs): 121.4 lbs Total Body Water (lbs): 84.4 lbs Visceral Fat Rating : 8 Other Clinical Data Fasting: no Labs: no Today's Visit #: 12 Starting Date: 01/28/23

## 2024-03-23 NOTE — Assessment & Plan Note (Signed)
 Patient having improved symptoms of anhedonia on lexapro .  Needs a refill today.  No change in dose but will follow up on symptoms at next appointment.  90 rx sent to pharmacy

## 2024-04-11 ENCOUNTER — Encounter (INDEPENDENT_AMBULATORY_CARE_PROVIDER_SITE_OTHER): Payer: Self-pay | Admitting: Adult Health

## 2024-04-11 ENCOUNTER — Ambulatory Visit (INDEPENDENT_AMBULATORY_CARE_PROVIDER_SITE_OTHER): Admitting: Adult Health

## 2024-04-11 VITALS — BP 92/69 | HR 90 | Temp 98.2°F | Ht 66.0 in | Wt 213.0 lb

## 2024-04-11 DIAGNOSIS — E669 Obesity, unspecified: Secondary | ICD-10-CM | POA: Diagnosis not present

## 2024-04-11 DIAGNOSIS — E7800 Pure hypercholesterolemia, unspecified: Secondary | ICD-10-CM

## 2024-04-11 DIAGNOSIS — Z6834 Body mass index (BMI) 34.0-34.9, adult: Secondary | ICD-10-CM

## 2024-04-11 DIAGNOSIS — E559 Vitamin D deficiency, unspecified: Secondary | ICD-10-CM | POA: Diagnosis not present

## 2024-04-11 DIAGNOSIS — E88819 Insulin resistance, unspecified: Secondary | ICD-10-CM

## 2024-04-11 DIAGNOSIS — E66811 Obesity, class 1: Secondary | ICD-10-CM

## 2024-04-11 MED ORDER — VITAMIN D (ERGOCALCIFEROL) 1.25 MG (50000 UNIT) PO CAPS: 50000.0000 [IU] | ORAL_CAPSULE | ORAL | 0 refills | Status: DC

## 2024-04-11 MED ORDER — WEGOVY 0.25 MG/0.5ML ~~LOC~~ SOAJ
0.2500 mg | SUBCUTANEOUS | 0 refills | Status: DC
Start: 2024-04-11 — End: 2024-06-08

## 2024-04-11 NOTE — Progress Notes (Signed)
 WEIGHT SUMMARY AND BIOMETRICS  Vitals Temp: 98.2 F (36.8 C) BP: 92/69 Pulse Rate: 90 SpO2: 97 %   Anthropometric Measurements Height: 5' 6 (1.676 m) Weight: 213 lb (96.6 kg) BMI (Calculated): 34.4 Weight at Last Visit: 211 lb Weight Lost Since Last Visit: 0 Weight Gained Since Last Visit: 2 lb Starting Weight: 215 lb Total Weight Loss (lbs): 2 lb (0.907 kg)   Body Composition  Body Fat %: 41.1 % Fat Mass (lbs): 87.6 lbs Muscle Mass (lbs): 119.2 lbs Total Body Water (lbs): 85.4 lbs Visceral Fat Rating : 8   Other Clinical Data Fasting: no Labs: no Today's Visit #: 13 Starting Date: 01/28/23    Chief Complaint:   OBESITY Mary [name] is here to discuss her progress with her obesity treatment plan.  She is on the practicing portion control and making smarter food choices, such as increasing vegetables and decreasing simple carbohydrates and states she is following her eating plan approximately 25 % of the time.  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 She denies family hx of MENS 2 or MTC She denies personal hx of pancreatitis Birth Control- IUD  Subjective:   1. Vitamin D  deficiency  Latest Reference Range & Units 11/03/23 10:59  Vitamin D , 25-Hydroxy 30.0 - 100.0 ng/mL 29.6 (L)  (L): Data is abnormally low  Last refill of Ergocalciferol  was March 2025 ???  2. Pure hypercholesterolemia Lipid Panel     Component Value Date/Time   CHOL 157 11/03/2023 1059   TRIG 82 11/03/2023 1059   HDL 30 (L) 11/03/2023 1059   LDLCALC 111 (H) 11/03/2023 1059   LABVLDL 16 11/03/2023 1059    HDL below  goal and LDL above goal She is not on statin therapy  3. Insulin  resistance Per pt- she never started weekly Wegovy  0.25mg - Rx was sent in at last [facility] on 03/14/2024 ??? She denies family hx of MENS 2 or MTC She denies personal hx of pancreatitis Birth Control- IUD  Assessment/Plan:   1. Vitamin D  deficiency (Primary) Refill Vitamin D , Ergocalciferol , (DRISDOL ) 1.25 MG (50000 UNIT) CAPS capsule Take 1 capsule (50,000 Units total) by mouth every 7 (seven) days. Dispense: 4 capsule, Refills: 0 ordered   2. Pure hypercholesterolemia Check Labs at next OV  3. Insulin  resistance Start - Semaglutide -Weight Management (WEGOVY ) 0.25 MG/0.5ML SOAJ; Inject 0.25 mg into the skin once a week.  Dispense: 2 mL; Refill: 0  4. BMI 34.0-34.9,adult, CURRENT BMI 34.4 Start - Semaglutide -Weight Management (WEGOVY ) 0.25 MG/0.5ML SOAJ; Inject 0.25 mg into the skin once a week.  Dispense: 2 mL; Refill: 0  Mary [name] is currently in the action stage of change. As such, her goal is to continue with weight loss efforts. She has agreed to practicing portion control and making smarter food choices, such as increasing vegetables and decreasing simple carbohydrates.   Exercise goals: Walk at least once per week  Behavioral modification strategies: increasing lean protein intake, decreasing simple carbohydrates, increasing vegetables, increasing water intake, decreasing eating out, no skipping meals, meal planning and cooking strategies,  keeping healthy foods in the home, ways to avoid boredom eating, ways to avoid night time snacking, better snacking choices, emotional eating strategies, planning for success, and decreasing junk food.  Mary [name] has agreed to follow-up with our clinic in 4 weeks. She was informed of the importance of frequent follow-up visits to maximize her success with intensive lifestyle modifications for her multiple health conditions.   Check Fasting Labs at next OV  Objective:   Blood  pressure 92/69, pulse 90, temperature 98.2 F (36.8 C), height 5' 6 (1.676 m), weight 213 lb (96.6 kg), SpO2 97%, unknown if currently breastfeeding. Body mass index is 34.38 kg/m.  General: Cooperative, alert, well developed, in no acute distress. HEENT: Conjunctivae and lids unremarkable. Cardiovascular: Regular rhythm.  Lungs: Normal work of breathing. Neurologic: No focal deficits.   Lab Results  Component Value Date   CREATININE 0.74 11/03/2023   BUN 12 11/03/2023   NA 142 11/03/2023   K 4.1 11/03/2023   CL 106 11/03/2023   CO2 22 11/03/2023   Lab Results  Component Value Date   ALT 8 11/03/2023   AST 14 11/03/2023   ALKPHOS 91 11/03/2023   BILITOT 0.5 11/03/2023   Lab Results  Component Value Date   HGBA1C 5.1 11/03/2023   HGBA1C 5.2 01/28/2023   HGBA1C 5.0 07/24/2022   Lab Results  Component Value Date   INSULIN  16.3 11/03/2023   INSULIN  15.2 01/28/2023   INSULIN  17.5 07/24/2022   Lab Results  Component Value Date   TSH 2.490 01/28/2023   Lab Results  Component Value Date   CHOL 157 11/03/2023   HDL 30 (L) 11/03/2023   LDLCALC 111 (H) 11/03/2023   TRIG 82 11/03/2023   Lab Results  Component Value Date   VD25OH 29.6 (L) 11/03/2023   VD25OH 11.9 (L) 01/28/2023   Lab Results  Component Value Date   WBC 6.7 01/28/2023   HGB 12.3 01/28/2023   HCT 37.7 01/28/2023   MCV 84 01/28/2023   PLT 256 01/28/2023   No results found for: IRON , TIBC, FERRITIN  Attestation Statements:   Reviewed by clinician on day of visit: allergies, medications, problem list, medical history, surgical history, family history, social history, and previous encounter notes. I have reviewed the above documentation for accuracy and completeness, and I agree with the above. -  Kelina Beauchamp d. Avalee Castrellon, NP-C

## 2024-04-12 ENCOUNTER — Telehealth (INDEPENDENT_AMBULATORY_CARE_PROVIDER_SITE_OTHER): Payer: Self-pay | Admitting: *Deleted

## 2024-04-12 ENCOUNTER — Telehealth (INDEPENDENT_AMBULATORY_CARE_PROVIDER_SITE_OTHER): Payer: Self-pay

## 2024-04-12 NOTE — Telephone Encounter (Signed)
 Message from Plan PA Case: [id number], Status: Denied. Notification: Completed. Wegovy  pen request has been denied. We may be able to approve this drug in a certain situation(life style modifications including structured nutrition and physical activity, unless physical activity is not clinically appropriate at the time therapy will commence with the requested drug.  You should be receiving a letter concerning this information,you may also discuss other options with physician at upcoming appointment. Patient has been updated thru Mychart.

## 2024-04-12 NOTE — Telephone Encounter (Signed)
  Subject: Pharmacy prior authorization request number [id number] Regarding member: [id number]; Mary [name]; 1995-11-25  Dear Dr. rockie DALTON  [facility] reviewed your WEGOVY  0.25 MG/0.5 ML PEN request for the above-identified  member, and it is denied for the following reason: because we did not see what we need to  approve the drug you asked for, (Wegovy  0.25 milligram per 0.5 milliliter pen).

## 2024-04-14 ENCOUNTER — Telehealth (INDEPENDENT_AMBULATORY_CARE_PROVIDER_SITE_OTHER): Payer: Self-pay | Admitting: Family Medicine

## 2024-04-14 NOTE — Telephone Encounter (Signed)
 Patient called stating that her Wegovy  has been denied. Patient would like to know what she must do at this point. Please contact patient at [phone].

## 2024-04-14 NOTE — Telephone Encounter (Signed)
 Spoke w/ pt; pt aware that she can try to appeal the decision or can discuss other options at her next appt with Dr U.-CS

## 2024-05-08 ENCOUNTER — Other Ambulatory Visit (INDEPENDENT_AMBULATORY_CARE_PROVIDER_SITE_OTHER): Payer: Self-pay | Admitting: Adult Health

## 2024-05-08 DIAGNOSIS — E559 Vitamin D deficiency, unspecified: Secondary | ICD-10-CM

## 2024-05-10 ENCOUNTER — Ambulatory Visit (INDEPENDENT_AMBULATORY_CARE_PROVIDER_SITE_OTHER): Admitting: Family Medicine

## 2024-05-10 ENCOUNTER — Encounter (INDEPENDENT_AMBULATORY_CARE_PROVIDER_SITE_OTHER): Payer: Self-pay | Admitting: Family Medicine

## 2024-05-10 VITALS — BP 106/72 | HR 78 | Temp 98.1°F | Ht 66.0 in | Wt 208.0 lb

## 2024-05-10 DIAGNOSIS — Z6833 Body mass index (BMI) 33.0-33.9, adult: Secondary | ICD-10-CM | POA: Diagnosis not present

## 2024-05-10 DIAGNOSIS — E785 Hyperlipidemia, unspecified: Secondary | ICD-10-CM

## 2024-05-10 DIAGNOSIS — E669 Obesity, unspecified: Secondary | ICD-10-CM | POA: Diagnosis not present

## 2024-05-10 DIAGNOSIS — E66811 Obesity, class 1: Secondary | ICD-10-CM

## 2024-05-10 DIAGNOSIS — E88819 Insulin resistance, unspecified: Secondary | ICD-10-CM | POA: Diagnosis not present

## 2024-05-10 NOTE — Progress Notes (Signed)
   SUBJECTIVE:  Chief Complaint: Obesity  Interim History: Patient voices she hasn't been up to much- she has been stress eating and mentions her mother's Section 8 housing issues as the cause.  She is still at her same job.  Feels like life will get easier when she is in her own space- she is hoping this will happen in the next 2 months.  She fluctuates between eating emotionally and not eating.  She tries to be mindful of her food intake and may eat something like baked chips but also may eat something like grape chicken salad.  Sharran is here to discuss her progress with her obesity treatment plan. She is on the practicing portion control and making smarter food choices, such as increasing vegetables and decreasing simple carbohydrates and states she is following her eating plan approximately 25 % of the time. She states she is exercising.   OBJECTIVE: Visit Diagnoses: Problem List Items Addressed This Visit   None   Vitals Temp: 98.1 F (36.7 C) BP: 106/72 Pulse Rate: 78 SpO2: 99 %   Anthropometric Measurements Height: 5' 6 (1.676 m) Weight: 208 lb (94.3 kg) BMI (Calculated): 33.59 Weight at Last Visit: 213 lb Weight Lost Since Last Visit: 5 Weight Gained Since Last Visit: 0 Starting Weight: 215 lb Total Weight Loss (lbs): 7 lb (3.175 kg)   Body Composition  Body Fat %: 38.5 % Fat Mass (lbs): 80 lbs Muscle Mass (lbs): 121.6 lbs Total Body Water (lbs): 82.4 lbs Visceral Fat Rating : 7   Other Clinical Data Today's Visit #: 14 Starting Date: 01/28/23 Comments: PC/Eva     ASSESSMENT AND PLAN: Assessment & Plan Dyslipidemia Last labs done in February of this year.  Patient had elevated LDL with a low HDL level at that time.  Will need repeat fasting labs done at next appointment.  Patient is aware to make a.m. appointment.  Continue to work on monitoring saturated fat intake to less than 20% of total intake. Insulin  resistance Hemoglobin A1c of 5.1 with an  insulin  level of 16.3 on labs done in February.  Patient has been working on lifestyle changes which include monitoring and limiting simple carbohydrate intake as well as increasing physical activity to help with blood glucose regulation.  Will repeat fasting labs at next appointment. Obesity with starting BMI of 34.8  BMI 33.0-33.9,adult    Diet: Satonya is currently in the action stage of change. As such, her goal is to continue with weight loss efforts and has agreed to practicing portion control and making smarter food choices, such as increasing vegetables and decreasing simple carbohydrates.   Exercise:  All adults should avoid inactivity. Some activity is better than none, and adults who participate in any amount of physical activity, gain some health benefits.  Behavior Modification:  We discussed the following Behavioral Modification Strategies today: increasing lean protein intake, decreasing simple carbohydrates, increasing vegetables, no skipping meals, and meal planning and cooking strategies.   Follow-up in 4 weeks.SABRA   She was informed of the importance of frequent follow up visits to maximize her success with intensive lifestyle modifications for her multiple health conditions.  Attestation Statements:   Reviewed by clinician on day of visit: allergies, medications, problem list, medical history, surgical history, family history, social history, and previous encounter notes.     Adelita Cho, MD

## 2024-05-12 ENCOUNTER — Encounter (INDEPENDENT_AMBULATORY_CARE_PROVIDER_SITE_OTHER): Payer: Self-pay

## 2024-05-16 NOTE — Assessment & Plan Note (Signed)
 Hemoglobin A1c of 5.1 with an insulin  level of 16.3 on labs done in February.  Patient has been working on lifestyle changes which include monitoring and limiting simple carbohydrate intake as well as increasing physical activity to help with blood glucose regulation.  Will repeat fasting labs at next appointment.

## 2024-05-18 ENCOUNTER — Telehealth (INDEPENDENT_AMBULATORY_CARE_PROVIDER_SITE_OTHER): Payer: Self-pay | Admitting: Family Medicine

## 2024-05-18 NOTE — Telephone Encounter (Signed)
 8/27 pt called and said she was initially denied for wegovy  but her insurance now stated she is approved can the pt be called

## 2024-05-19 NOTE — Telephone Encounter (Signed)
 Spoke w./ pt, aware of Mychart message and that medication approved, check with the pharmacy.

## 2024-06-07 ENCOUNTER — Ambulatory Visit (INDEPENDENT_AMBULATORY_CARE_PROVIDER_SITE_OTHER): Admitting: Adult Health

## 2024-06-08 ENCOUNTER — Encounter (INDEPENDENT_AMBULATORY_CARE_PROVIDER_SITE_OTHER): Payer: Self-pay | Admitting: Adult Health

## 2024-06-08 ENCOUNTER — Ambulatory Visit (INDEPENDENT_AMBULATORY_CARE_PROVIDER_SITE_OTHER): Admitting: Adult Health

## 2024-06-08 VITALS — BP 107/72 | HR 77 | Ht 66.0 in | Wt 208.0 lb

## 2024-06-08 DIAGNOSIS — E66811 Obesity, class 1: Secondary | ICD-10-CM

## 2024-06-08 DIAGNOSIS — E559 Vitamin D deficiency, unspecified: Secondary | ICD-10-CM | POA: Diagnosis not present

## 2024-06-08 DIAGNOSIS — E88819 Insulin resistance, unspecified: Secondary | ICD-10-CM | POA: Diagnosis not present

## 2024-06-08 DIAGNOSIS — Z6833 Body mass index (BMI) 33.0-33.9, adult: Secondary | ICD-10-CM

## 2024-06-08 DIAGNOSIS — E7800 Pure hypercholesterolemia, unspecified: Secondary | ICD-10-CM | POA: Diagnosis not present

## 2024-06-08 DIAGNOSIS — I10 Essential (primary) hypertension: Secondary | ICD-10-CM | POA: Diagnosis not present

## 2024-06-08 MED ORDER — VITAMIN D (ERGOCALCIFEROL) 1.25 MG (50000 UNIT) PO CAPS: 50000.0000 [IU] | ORAL_CAPSULE | ORAL | 0 refills | Status: DC

## 2024-06-08 MED ORDER — WEGOVY 0.25 MG/0.5ML ~~LOC~~ SOAJ: 0.2500 mg | SUBCUTANEOUS | 0 refills | Status: DC

## 2024-06-08 NOTE — Progress Notes (Signed)
 WEIGHT SUMMARY AND BIOMETRICS  Vitals BP: 107/72 Pulse Rate: 77 SpO2: 98 %   Anthropometric Measurements Height: 5' 6 (1.676 m) Weight: 208 lb (94.3 kg) BMI (Calculated): 33.59 Weight at Last Visit: 208lb Weight Lost Since Last Visit: 0lb Weight Gained Since Last Visit: 0lb Starting Weight: 215lb Total Weight Loss (lbs): 7 lb (3.175 kg)   Body Composition  Body Fat %: 39.3 % Fat Mass (lbs): 81.8 lbs Muscle Mass (lbs): 119.8 lbs Total Body Water (lbs): 86.4 lbs Visceral Fat Rating : 7   Other Clinical Data Fasting: Yes Labs: Yes Today's Visit #: 15 Starting Date: 01/28/23    Chief Complaint:   OBESITY Mary [name] is here to discuss her progress with her obesity treatment plan.  She is on the the Category 1 Plan and states she is following her eating plan approximately 50 % of the time.  She states she is exercising Walking 45 minutes 2 times per week.  Interim History:  Wegovy  0.25mg /0.25mL pen units of 2.00 approved for 05/11/24-11/07/24.  She started weekly Wegovy  0.25mg  on/about She picked up Rx early Sept 2025 05/26/24 First pen- she administered medication into the air           Second pen- her maternal uncle properly injected Wegovy  inter her abdomen 06/02/24 Firs pen- medication did not instill into tissue, rather atop her abdomen           Second pen- she as able to correctly administer GLP-1 into her abdomen  Ms. 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 Insulin  resistance  Latest Reference Range & Units 07/24/22 09:22 01/28/23 09:31 11/03/23 10:59  INSULIN  2.6 - 24.9 uIU/mL 17.5 15.2 16.3   Wegovy  0.25mg /0.37mL pen units of 2.00 approved for 05/11/24-11/07/24.  She started weekly  Wegovy  0.25mg  on/about She picked up Rx early Sept 2025  05/26/24 First pen- she administered medication into the air           Second pen- her maternal uncle properly injected Wegovy  inter her abdomen 06/02/24 Firs pen- medication did not instill into tissue, rather atop her abdomen           Second pen- she as able to correctly administer GLP-1 into her abdomen She recently started weekly Wegovy  0.25mg - due to user error, she has only had two injections of the loading dose. After each successful injection she reports queasiness and low appetite.  2. Pure hypercholesterolemia She is not on statin therapy She recently started weekly Wegovy  0.25mg - due to user error, she has only had two injections of the loading dose (05/26/24 and 06/02/24) She is not on statin therapy  3. Essential hypertension BP at goal at OV She is on daily Amloidpine 10mg  She denies bilateral lower extremity edema  4. Vitamin D  deficiency  Latest Reference Range & Units 11/03/23 10:59  Vitamin D , 25-Hydroxy 30.0 - 100.0 ng/mL 29.6 (L)  (L): Data is abnormally low  She is taking weekly Ergocalciferol - denies N/V/Muscle Weakness  Assessment/Plan:   1. Insulin  resistance (Primary) Check Labs - Hemoglobin A1c - Insulin , random  2. Pure hypercholesterolemia Check Labs - Lipid panel  3. Essential hypertension Check Labs - Comprehensive metabolic panel with GFR  4. Vitamin D  deficiency  Check Labs - VITAMIN D  25 Hydroxy (Vit-D Deficiency, Fractures) Refill Vitamin D , Ergocalciferol , (DRISDOL ) 1.25 MG (50000 UNIT) CAPS capsule Take 1 capsule (50,000 Units total) by mouth every 7 (seven) days. Dispense: 4 capsule, Refills: 0 ordered   5. BMI 33.0-33.9,adult, CURRENT BMI 33.6 Refill semaglutide -weight management (WEGOVY ) 0.25 MG/0.5ML SOAJ SQ injection Inject 0.25 mg into the skin once a week. Dispense: 2 mL, Refills: 0 ordered   06/09/24 Wegovy  0.25 06/16/24 Wegovy  0.25 06/23/24 Wegovy  0.25 06/30/24 Wegovy   0.25  Mary [name] is currently in the action stage of change. As such, her goal is to continue with weight loss efforts. She has agreed to the Category 1 Plan.   Exercise goals: All adults should avoid inactivity. Some physical activity is better than none, and adults who participate in any amount of physical activity gain some health benefits. Adults should also include muscle-strengthening activities that involve all major muscle groups on 2 or more days a week.  Behavioral modification strategies: increasing lean protein intake, decreasing simple carbohydrates, increasing vegetables, increasing water intake, meal planning and cooking strategies, keeping healthy foods in the home, ways to avoid boredom eating, ways to avoid night time snacking, and planning for success.  Mary [name] has agreed to follow-up with our clinic in 4 weeks. She was informed of the importance of frequent follow-up visits to maximize her success with intensive lifestyle modifications for her multiple health conditions.   Mary [name] was informed we would discuss her lab results at her next visit unless there is a critical issue that needs to be addressed sooner. Mary [name] agreed to keep her next visit at the agreed upon time to discuss these results.  Objective:   Blood pressure 107/72, pulse 77, height 5' 6 (1.676 m), weight 208 lb (94.3 kg), last menstrual period 04/28/2024, SpO2 98%, unknown if currently breastfeeding. Body mass index is 33.57 kg/m.  General: Cooperative, alert, well developed, in no acute distress. HEENT: Conjunctivae and lids unremarkable. Cardiovascular: Regular rhythm.  Lungs: Normal work of breathing. Neurologic: No focal deficits.   Lab Results  Component Value Date   CREATININE 0.74 11/03/2023   BUN 12 11/03/2023   NA 142 11/03/2023   K 4.1 11/03/2023   CL 106 11/03/2023   CO2 22 11/03/2023   Lab Results  Component Value Date   ALT 8 11/03/2023   AST 14 11/03/2023   ALKPHOS 91 11/03/2023    BILITOT 0.5 11/03/2023   Lab Results  Component Value Date   HGBA1C 5.1 11/03/2023   HGBA1C 5.2 01/28/2023   HGBA1C 5.0 07/24/2022   Lab Results  Component Value Date   INSULIN  16.3 11/03/2023   INSULIN  15.2 01/28/2023   INSULIN  17.5 07/24/2022   Lab Results  Component Value Date   TSH 2.490 01/28/2023   Lab Results  Component Value Date   CHOL 157 11/03/2023   HDL 30 (L) 11/03/2023   LDLCALC 111 (H) 11/03/2023   TRIG 82 11/03/2023   Lab Results  Component Value Date   VD25OH 29.6 (L) 11/03/2023   VD25OH 11.9 (L) 01/28/2023   Lab Results  Component Value Date   WBC 6.7 01/28/2023   HGB 12.3 01/28/2023   HCT 37.7 01/28/2023   MCV 84 01/28/2023   PLT 256 01/28/2023   No results found for: IRON , TIBC, FERRITIN  Attestation Statements:   Reviewed by clinician on day of visit: allergies, medications, problem list, medical history, surgical history, family history, social history, and previous encounter notes.  I have reviewed the above documentation  for accuracy and completeness, and I agree with the above. -  Majed Pellegrin d. Amore Ackman, NP-C

## 2024-06-09 LAB — HEMOGLOBIN A1C
Est. average glucose Bld gHb Est-mCnc: 94 mg/dL
Hgb A1c MFr Bld: 4.9 % (ref 4.8–5.6)

## 2024-06-09 LAB — LIPID PANEL
Chol/HDL Ratio: 5.4 ratio — ABNORMAL HIGH (ref 0.0–4.4)
Cholesterol, Total: 172 mg/dL (ref 100–199)
HDL: 32 mg/dL — ABNORMAL LOW (ref >39)
LDL Chol Calc (NIH): 119 mg/dL — ABNORMAL HIGH (ref 0–99)
Triglycerides: 115 mg/dL (ref 0–149)
VLDL Cholesterol Cal: 21 mg/dL (ref 5–40)

## 2024-06-09 LAB — COMPREHENSIVE METABOLIC PANEL WITH GFR
ALT: 7 IU/L (ref 0–32)
AST: 14 IU/L (ref 0–40)
Albumin: 4.4 g/dL (ref 4.0–5.0)
Alkaline Phosphatase: 89 IU/L (ref 41–116)
BUN/Creatinine Ratio: 14 (ref 9–23)
BUN: 10 mg/dL (ref 6–20)
Bilirubin Total: 0.6 mg/dL (ref 0.0–1.2)
CO2: 19 mmol/L — ABNORMAL LOW (ref 20–29)
Calcium: 8.9 mg/dL (ref 8.7–10.2)
Chloride: 107 mmol/L — ABNORMAL HIGH (ref 96–106)
Creatinine, Ser: 0.72 mg/dL (ref 0.57–1.00)
Globulin, Total: 2.4 g/dL (ref 1.5–4.5)
Glucose: 75 mg/dL (ref 70–99)
Potassium: 3.9 mmol/L (ref 3.5–5.2)
Sodium: 141 mmol/L (ref 134–144)
Total Protein: 6.8 g/dL (ref 6.0–8.5)
eGFR: 116 mL/min/1.73 (ref >59)

## 2024-06-09 LAB — INSULIN, RANDOM: INSULIN: 18.2 u[IU]/mL (ref 2.6–24.9)

## 2024-06-09 LAB — VITAMIN D 25 HYDROXY (VIT D DEFICIENCY, FRACTURES): Vit D, 25-Hydroxy: 16.3 ng/mL — ABNORMAL LOW (ref 30.0–100.0)

## 2024-07-13 ENCOUNTER — Ambulatory Visit (INDEPENDENT_AMBULATORY_CARE_PROVIDER_SITE_OTHER): Admitting: Family Medicine

## 2024-07-20 ENCOUNTER — Ambulatory Visit (INDEPENDENT_AMBULATORY_CARE_PROVIDER_SITE_OTHER): Admitting: Family Medicine

## 2024-07-20 ENCOUNTER — Ambulatory Visit (HOSPITAL_COMMUNITY)
Admission: EM | Admit: 2024-07-20 | Discharge: 2024-07-20 | Disposition: A | Discharge status: Home / Self Care | Attending: Emergency Medicine | Admitting: Emergency Medicine

## 2024-07-20 ENCOUNTER — Encounter (INDEPENDENT_AMBULATORY_CARE_PROVIDER_SITE_OTHER): Payer: Self-pay | Admitting: Family Medicine

## 2024-07-20 ENCOUNTER — Encounter (HOSPITAL_COMMUNITY): Payer: Self-pay

## 2024-07-20 VITALS — BP 89/65 | HR 72 | Temp 97.8°F | Ht 66.0 in | Wt 204.0 lb

## 2024-07-20 DIAGNOSIS — Z6834 Body mass index (BMI) 34.0-34.9, adult: Secondary | ICD-10-CM

## 2024-07-20 DIAGNOSIS — E88819 Insulin resistance, unspecified: Secondary | ICD-10-CM

## 2024-07-20 DIAGNOSIS — Z6832 Body mass index (BMI) 32.0-32.9, adult: Secondary | ICD-10-CM

## 2024-07-20 DIAGNOSIS — E669 Obesity, unspecified: Secondary | ICD-10-CM

## 2024-07-20 DIAGNOSIS — E559 Vitamin D deficiency, unspecified: Secondary | ICD-10-CM

## 2024-07-20 DIAGNOSIS — E66811 Obesity, class 1: Secondary | ICD-10-CM

## 2024-07-20 DIAGNOSIS — N898 Other specified noninflammatory disorders of vagina: Secondary | ICD-10-CM | POA: Diagnosis present

## 2024-07-20 DIAGNOSIS — Z91199 Patient's noncompliance with other medical treatment and regimen due to unspecified reason: Secondary | ICD-10-CM

## 2024-07-20 DIAGNOSIS — E7800 Pure hypercholesterolemia, unspecified: Secondary | ICD-10-CM | POA: Diagnosis not present

## 2024-07-20 DIAGNOSIS — Z113 Encounter for screening for infections with a predominantly sexual mode of transmission: Secondary | ICD-10-CM | POA: Diagnosis present

## 2024-07-20 LAB — HIV ANTIBODY (ROUTINE TESTING W REFLEX): HIV Screen 4th Generation wRfx: NONREACTIVE

## 2024-07-20 MED ORDER — VITAMIN D (ERGOCALCIFEROL) 1.25 MG (50000 UNIT) PO CAPS: 50000.0000 [IU] | ORAL_CAPSULE | ORAL | 0 refills | Status: DC

## 2024-07-20 NOTE — Discharge Instructions (Signed)
 Your results will come back over the next few days and someone will call if results are positive and require treatment.  Follow-up with your primary care provider or return here as needed.

## 2024-07-20 NOTE — ED Provider Notes (Signed)
 [facility]    CSN: [id number] Arrival date & time: 07/20/24  1347      History   Chief Complaint Chief Complaint  Patient presents with   Medication Refill    HPI Mary [name] is a 28 y.o. female.   Patient presents with vaginal itching for about 4 days.  Patient denies any significant increase in vaginal discharge or abnormal vaginal discharge.  Patient denies vaginal pain, lesions, rash, dysuria, hematuria, urinary frequency/urgency, abdominal pain, flank pain, and fever.  Patient states that she did start her menstrual cycle today.  Patient does have an IUD.  Patient reports that she was recently sexually active with a new partner and therefore is requesting STD testing.  Patient denies any known exposures to STDs.  Patient does have a history of recurrent genital herpes and is requesting a prescription for Valtrex  today.  Patient denies any lesions, rash, or vaginal pain at this time.  The history is provided by the patient and medical records.  Medication Refill   Past Medical History:  Diagnosis Date   ADD (attention deficit disorder)    Anxiety    Asthma    as a child   Back pain    Chlamydia    Depression    History of gestational hypertension 11/27/2020   IBS (irritable bowel syndrome)    Joint pain    Recurrent genital herpes 11/27/2020   Sleep apnea    SOB (shortness of breath)    Trichomonas     Patient Active Problem List   Diagnosis Date Noted   Class 1 obesity with serious comorbidity and body mass index (BMI) of 34.0 to 34.9 in adult 01/06/2024   Insulin  resistance 11/03/2023   HLD (hyperlipidemia) 09/09/2023   Vitamin D  deficiency 08/18/2023   ASCUS with positive high risk HPV cervical on 10/07/2019 12/27/2020   Anxiety and depression    Paragard  IUD (intrauterine device) in place since 11/27/2020 11/27/2020   Seasonal allergies 04/14/2013   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 Authorizing Provider  escitalopram  (LEXAPRO ) 10 MG tablet Take 1.5 tablets (15 mg total) by mouth daily. 03/14/24   [place] Adelita PENNER, MD  PARAGARD  INTRAUTERINE COPPER  IU by Intrauterine route.    [provider]  semaglutide -weight management (WEGOVY ) 0.25 MG/0.5ML SOAJ SQ injection Inject 0.25 mg into the skin once a week. Patient not taking: Reported on 07/20/2024 06/08/24   Jonel Pee D, NP  valACYclovir  (VALTREX ) 500 MG tablet Take 1 tablet (500 mg total) by mouth daily. Start at 36 weeks 04/07/22   Cleatus Moccasin, MD  Vitamin D , Ergocalciferol , (DRISDOL ) 1.25 MG (50000 UNIT) CAPS capsule Take 1 capsule (50,000 Units total) by mouth every 7 (seven) days. 07/20/24   Midge Sober, DO    Family History Family History  Problem Relation Age of Onset   Depression  Mother    Bipolar disorder Mother    Diabetes type II Mother    Seizures Mother    Hypertension Mother    Obesity Mother    Diabetes Mother    Mental illness Mother    Sleep apnea Mother    Cancer Maternal Grandmother    Stroke Maternal Grandmother    Cancer Maternal Uncle     Social History Social History   Tobacco Use   Smoking status: Never   Smokeless tobacco: Never  Vaping Use   Vaping status: Never Used  Substance Use Topics   Alcohol use: No   Drug use: No     Allergies   Patient has no known allergies.   Review of Systems Review of Systems  Per HPI  Physical Exam Triage Vital Signs ED Triage Vitals [07/20/24 1504]  Encounter Vitals Group     BP 104/70     Girls Systolic BP Percentile      Girls Diastolic BP Percentile      Boys  Systolic BP Percentile      Boys Diastolic BP Percentile      Pulse Rate 70     Resp 16     Temp 97.9 F (36.6 C)     Temp Source Oral     SpO2 97 %     Weight      Height      Head Circumference      Peak Flow      Pain Score 6     Pain Loc      Pain Education      Exclude from Growth Chart    No data found.  Updated Vital Signs BP 104/70 (BP Location: Left Arm)   Pulse 70   Temp 97.9 F (36.6 C) (Oral)   Resp 16   SpO2 97%   Visual Acuity Right Eye Distance:   Left Eye Distance:   Bilateral Distance:    Right Eye Near:   Left Eye Near:    Bilateral Near:     Physical Exam Vitals and nursing note reviewed.  Constitutional:      General: She is awake. She is not in acute distress.    Appearance: Normal appearance. She is well-developed and well-groomed. She is not ill-appearing.  Abdominal:     General: Abdomen is flat. Bowel sounds are normal. There is no distension.     Palpations: Abdomen is soft.     Tenderness: There is no abdominal tenderness. There is no right CVA tenderness, left CVA tenderness, guarding or rebound.  Genitourinary:    Comments: Exam deferred Skin:    General: Skin is warm and dry.  Neurological:     Mental Status: She is alert.  Psychiatric:        Behavior: Behavior is cooperative.      UC Treatments / Results  Labs (all labs ordered are listed, but only abnormal results are displayed) Labs Reviewed  HIV ANTIBODY (ROUTINE TESTING W REFLEX)  RPR  CERVICOVAGINAL ANCILLARY ONLY    EKG   Radiology No results found.  Procedures Procedures (including critical care time)  Medications Ordered in UC Medications - No data to display  Initial Impression / Assessment and Plan / UC Course  I have reviewed the triage vital signs and the nursing notes.  Pertinent labs & imaging results that were available during my care of the patient were reviewed by me and considered in my medical decision making (see chart for  details).  Patient is overall well-appearing.  Vitals are stable.  GU exam deferred.  Patient performed self swab for STD/STI.  HIV and RPR ordered.  Deferred prescription for Valtrex  at this time due to no active outbreak of genital herpes.  Discussed follow-up and return precautions. Final Clinical Impressions(s) / UC Diagnoses   Final diagnoses:  Vaginal irritation  Screen for STD (sexually transmitted disease)     Discharge Instructions      Your results will come back over the next few days and someone will call if results are positive and require treatment.  Follow-up with your primary care provider or return here as needed.     ED Prescriptions   None    PDMP not reviewed this encounter.   Johnie Flaming A, NP 07/20/24 1531

## 2024-07-20 NOTE — ED Triage Notes (Addendum)
 Patient is requesting STD testing due to vaginal itching x 4 days.  Patient is also requesting medication refill for Valtrex .

## 2024-07-20 NOTE — Progress Notes (Signed)
 Mary [name], D.O.  ABFM, ABOM Specializing in Clinical Bariatric Medicine  Office located at: [address] [place], [place]  [address]    FOR THE CHRONIC DISEASE OF OBESITY:   Obesity with starting BMI of 34.8 BMI 34.0-34.9,adult, CURRENT BMI 32.94  Weight Summary and Body Composition Analysis  Weight Lost Since Last Visit: 4lb  Weight Gained Since Last Visit: 0lb    Vitals Temp: 97.8 F (36.6 C) BP: (!) 89/65 Pulse Rate: 72 SpO2: 99 %   Anthropometric Measurements Height: 5' 6 (1.676 m) Weight: 204 lb (92.5 kg) BMI (Calculated): 32.94 Weight at Last Visit: 208lb Weight Lost Since Last Visit: 4lb Weight Gained Since Last Visit: 0lb Starting Weight: 215lb Total Weight Loss (lbs): 11 lb (4.99 kg)   Body Composition  Body Fat %: 39.1 % Fat Mass (lbs): 79.8 lbs Muscle Mass (lbs): 118 lbs Total Body Water (lbs): 83.8 lbs Visceral Fat Rating : 7   Other Clinical Data Fasting: yes Labs: no Today's Visit #: 16 Starting Date: 01/28/23    Chief complaint: Obesity  Interval History Mary [name] is here for a follow-up office visit to discuss her progress with her obesity treatment plan. She is on the Category 1 Plan and states she is following her eating plan approximately 25% of the time. She is walking 10,000 - 11,000 steps at work daily. She does not exercise beyond walking at work.  This is my first encounter with [name].  She has experienced a weight loss of 4 lbs since last OV on 06/08/2024.   Her dietary and life habits include:  - Tracking Calories/Macros: no - she is not on a journaling plan  - Eating More Whole Foods: no  - She is intentional about practicing portion control.  - Adequate Protein Intake: no  - Adequate Water Intake: no - drinks 3 bottles of water daily  - Skipping Meals: yes - she sometimes skips breakfast  - Sleeping 7-9 Hours/ Night: no    06/08/24 09:00 07/20/24 12:00   Body Fat % 39.3 % 39.1 %   Muscle Mass (lbs) 119.8 lbs 118 lbs  Fat Mass (lbs) 81.8 lbs 79.8 lbs  Total Body Water (lbs) 86.4 lbs 83.8 lbs  Visceral Fat Rating  7 7   Counseling done on how various foods will affect these numbers and how to maximize success  Total lbs lost to date: - 11 lbs Total Fat Mass lost to date: -16.6 lbs Total weight loss percentage to date: - 5.12 %   Nutritional and Behavioral Counseling:  We discussed the following today: increasing lean protein intake to established goals, importance of having a variety of fruits and vegetables, avoiding skipping meals, increasing water intake , and work on managing stress, creating time for self-care and relaxation  Additional resources provided today: Handout on CAT 1 meal plan   Evidence-based interventions for health behavior change were utilized today including the discussion of self monitoring techniques, problem-solving barriers and SMART goal setting techniques.   Regarding patient's less desirable eating habits and patterns, we employed the technique of small changes.   SMART Goal(s) created today: n/a   Recommended Dietary Goals Mary [name] is currently in the action stage of change. As such, her goal is to continue weight management plan.  She has agreed to: continue the CAT 1 MP   Recommended Physical Activity Goals Mary [name] has been advised to work up to 300-450 minutes of moderate intensity aerobic activity a week and strengthening exercises 2-3 times  per week for cardiovascular health, weight loss maintenance and preservation of muscle mass.   She may Continue to gradually increase the amount and intensity of exercise routine   Medical Interventions and Pharmacotherapy Previous Bariatric surgery: n/a Pharmacotherapy: She has not taken Wegovy  since September due to fear of self-administration. She currently has approximately 5 weeks of medication remaining, after which she will have no supply, as Medicaid coverage for obesity was  discontinued as of October 1. Given her fear of using the medication, two-month lapse in use, upcoming loss of coverage, and suboptimal dietary habits, I do not recommend she restart Wegovy  at this time.    OBESITY RELATED CONDITIONS ADDRESSED TODAY:   Medications Discontinued During This Encounter  Medication Reason   amLODipine  (NORVASC ) 10 MG tablet Discontinued by provider   Vitamin D , Ergocalciferol , (DRISDOL ) 1.25 MG (50000 UNIT) CAPS capsule Reorder     Meds ordered this encounter  Medications   Vitamin D , Ergocalciferol , (DRISDOL ) 1.25 MG (50000 UNIT) CAPS capsule    Sig: Take 1 capsule (50,000 Units total) by mouth every 7 (seven) days.    Dispense:  4 capsule    Refill:  0     Insulin  resistance Assessment & Plan: Lab Results  Component Value Date   HGBA1C 4.9 06/08/2024   HGBA1C 5.1 11/03/2023   HGBA1C 5.2 01/28/2023   INSULIN  18.2 06/08/2024   INSULIN  16.3 11/03/2023   INSULIN  15.2 01/28/2023   Lab Results  Component Value Date   CREATININE 0.72 06/08/2024   BUN 10 06/08/2024   NA 141 06/08/2024   K 3.9 06/08/2024   CL 107 (H) 06/08/2024   CO2 19 (L) 06/08/2024      Component Value Date/Time   PROT 6.8 06/08/2024 1053   ALBUMIN 4.4 06/08/2024 1053   AST 14 06/08/2024 1053   ALT 7 06/08/2024 1053   ALKPHOS 89 06/08/2024 1053   BILITOT 0.6 06/08/2024 1053   BILIDIR 0.2 08/08/2020 0000   IBILI 0.4 08/08/2020 0000   I.R managed with dietary and life style interventions. Reviewed labs with pt. Kidney function, electrolytes, and liver enzymes are WNL. Hemoglobin A1c at goal. Fasting insulin  elevated at 18.2; optimal level < 5. No complaints of excessive hunger, however she does crave sweets after dinner. Pt will work on increasing lean protein intake at dinner for craving suppression and reducing simple and added sugars in her diet. Continue working on nutrition plan to promote weight loss and prevent progression to Pre-DM.     Vitamin D   deficiency Assessment & Plan: Lab Results  Component Value Date   VD25OH 16.3 (L) 06/08/2024   VD25OH 29.6 (L) 11/03/2023   VD25OH 11.9 (L) 01/28/2023   She is prescribed Ergocalciferol  50,000 units weekly, however she reports only taking it 33% of the time. Reviewed labs with pt. Her Vit D level is not at goal of 50 to 70. Discussed the importance of vitamin D  to the patient's health and well-being as well as to their ability to lose weight. Encouraged consistent adherence to supplementation (refill today).     Pure hypercholesterolemia Assessment & Plan: Lab Results  Component Value Date   CHOL 172 06/08/2024   HDL 32 (L) 06/08/2024   LDLCALC 119 (H) 06/08/2024   TRIG 115 06/08/2024   CHOLHDL 5.4 (H) 06/08/2024   Condition managed with dietary and life style interventions. Reviewed labs with pt. LDL elevated at 119; HDL low at 32. Pt advised to  continue with our prudent nutritional plan that is low  in saturated and trans fats, and low in fatty carbs to improve these numbers. Increase exercise to eventual goal of 300-450 minutes of moderate intensity aerobic activity a week and strengthening exercises 2-3 times per week.    Objective:   PHYSICAL EXAM: Blood pressure (!) 89/65, pulse 72, temperature 97.8 F (36.6 C), height 5' 6 (1.676 m), weight 204 lb (92.5 kg), SpO2 99%, unknown if currently breastfeeding. Body mass index is 32.93 kg/m.  General: she is overweight, cooperative and in no acute distress. PSYCH: Has normal mood, affect and thought process.   HEENT: EOMI, sclerae are anicteric. Lungs: Normal breathing effort, no conversational dyspnea. Extremities: Moves * 4 Neurologic: A and O * 3, good insight  DIAGNOSTIC DATA REVIEWED: BMET    Component Value Date/Time   NA 141 06/08/2024 1053   K 3.9 06/08/2024 1053   CL 107 (H) 06/08/2024 1053   CO2 19 (L) 06/08/2024 1053   GLUCOSE 75 06/08/2024 1053   GLUCOSE 89 10/23/2020 2116   BUN 10 06/08/2024 1053    CREATININE 0.72 06/08/2024 1053   CREATININE 0.47 (L) 08/08/2020 0000   CALCIUM 8.9 06/08/2024 1053   GFRNONAA >60 10/23/2020 2116   GFRAA >60 09/28/2015 1612   Lab Results  Component Value Date   HGBA1C 4.9 06/08/2024   HGBA1C 5.0 07/24/2022   Lab Results  Component Value Date   INSULIN  18.2 06/08/2024   INSULIN  17.5 07/24/2022   Lab Results  Component Value Date   TSH 2.490 01/28/2023   CBC    Component Value Date/Time   WBC 6.7 01/28/2023 0931   WBC 9.9 10/23/2020 2116   RBC 4.51 01/28/2023 0931   RBC 4.24 10/23/2020 2116   HGB 12.3 01/28/2023 0931   HGB 12.4 04/18/2015 0000   HCT 37.7 01/28/2023 0931   HCT 37 04/18/2015 0000   PLT 256 01/28/2023 0931   PLT 192 04/18/2015 0000   MCV 84 01/28/2023 0931   MCH 27.3 01/28/2023 0931   MCH 27.8 10/23/2020 2116   MCHC 32.6 01/28/2023 0931   MCHC 32.4 10/23/2020 2116   RDW 13.2 01/28/2023 0931   Iron  Studies No results found for: IRON , TIBC, FERRITIN, IRONPCTSAT Lipid Panel     Component Value Date/Time   CHOL 172 06/08/2024 1053   TRIG 115 06/08/2024 1053   HDL 32 (L) 06/08/2024 1053   CHOLHDL 5.4 (H) 06/08/2024 1053   LDLCALC 119 (H) 06/08/2024 1053   Hepatic Function Panel     Component Value Date/Time   PROT 6.8 06/08/2024 1053   ALBUMIN 4.4 06/08/2024 1053   AST 14 06/08/2024 1053   ALT 7 06/08/2024 1053   ALKPHOS 89 06/08/2024 1053   BILITOT 0.6 06/08/2024 1053   BILIDIR 0.2 08/08/2020 0000   IBILI 0.4 08/08/2020 0000      Component Value Date/Time   TSH 2.490 01/28/2023 0931   Nutritional Lab Results  Component Value Date   VD25OH 16.3 (L) 06/08/2024   VD25OH 29.6 (L) 11/03/2023   VD25OH 11.9 (L) 01/28/2023     Follow up:   Return 08/25/2024 at 2:40 PM with [place] Adelita PENNER, MD.  She was informed of the importance of frequent follow up visits to maximize her success with intensive lifestyle modifications for her multiple health conditions.   Attestations:   I, Special  Puri, acting as a stage manager for [employer], DO., have compiled all relevant documentation for today's office visit on behalf of Mary Jenkins, DO, while in the presence of Mary  Gyanna Jarema, DO.  Pertinent positives were addressed with patient today. Reviewed by clinician on day of visit: allergies, medications, problem list, medical history, surgical history, family history, social history, and previous encounter notes.  I have reviewed the above documentation for accuracy and completeness, and I agree with the above. Mary JINNY [name], D.O.  The 21st Century Cures Act was signed into law in 2016 which includes the topic of electronic health records.  This provides immediate access to information in MyChart. This includes consultation notes, operative notes, office notes, lab results and pathology reports.  If you have any questions about what you read please let us  know at your next visit so we can discuss your concerns and take corrective action if need be.  We are right here with you.

## 2024-07-21 ENCOUNTER — Ambulatory Visit (HOSPITAL_COMMUNITY): Payer: Self-pay

## 2024-07-21 LAB — RPR: RPR Ser Ql: NONREACTIVE

## 2024-07-21 MED ORDER — FLUCONAZOLE 150 MG PO TABS: 150.0000 mg | ORAL_TABLET | Freq: Once | ORAL | 0 refills | Status: AC

## 2024-07-22 LAB — CERVICOVAGINAL ANCILLARY ONLY
Bacterial Vaginitis (gardnerella): POSITIVE — AB
Candida Glabrata: POSITIVE — AB
Candida Vaginitis: POSITIVE — AB
Chlamydia: NEGATIVE
Comment: NEGATIVE
Comment: NEGATIVE
Comment: NEGATIVE
Comment: NEGATIVE
Comment: NEGATIVE
Comment: NORMAL
Neisseria Gonorrhea: NEGATIVE
Trichomonas: NEGATIVE

## 2024-08-25 ENCOUNTER — Encounter (INDEPENDENT_AMBULATORY_CARE_PROVIDER_SITE_OTHER): Payer: Self-pay | Admitting: Family Medicine

## 2024-08-25 ENCOUNTER — Ambulatory Visit (INDEPENDENT_AMBULATORY_CARE_PROVIDER_SITE_OTHER): Admitting: Family Medicine

## 2024-08-25 VITALS — BP 117/63 | HR 69 | Temp 97.8°F | Ht 66.0 in | Wt 201.0 lb

## 2024-08-25 DIAGNOSIS — E66811 Obesity, class 1: Secondary | ICD-10-CM

## 2024-08-25 DIAGNOSIS — Z6832 Body mass index (BMI) 32.0-32.9, adult: Secondary | ICD-10-CM

## 2024-08-25 DIAGNOSIS — E785 Hyperlipidemia, unspecified: Secondary | ICD-10-CM

## 2024-08-25 DIAGNOSIS — E559 Vitamin D deficiency, unspecified: Secondary | ICD-10-CM

## 2024-08-25 MED ORDER — VITAMIN D (ERGOCALCIFEROL) 1.25 MG (50000 UNIT) PO CAPS: 50000.0000 [IU] | ORAL_CAPSULE | ORAL | 0 refills | Status: AC

## 2024-08-25 NOTE — Progress Notes (Signed)
° °  SUBJECTIVE:  Chief Complaint: Obesity  Interim History: Patient here for follow up.  She got a new apartment and she moved in.  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  She has been trying to cut back on indulgent food as much as possible.  She has been trying to stay more mindful of protein.  For Thanksgiving she didn't eat as much as she used to.  For month of December she is celebrating her mother's birthday and then taking her daughter to a spelling bee.  Over the next few weeks she wants to try to cook a bit more.   Mary [name] is here to discuss her progress with her obesity treatment plan. She is on the Category 1 Plan and states she is following her eating plan approximately 50 % of the time. She states she is exercising 10 minutes 2 times per week.   OBJECTIVE: Visit Diagnoses: Problem List Items Addressed This Visit   None   Vitals Temp: 97.8 F (36.6 C) BP: 117/63 Pulse Rate: 69 SpO2: 99 %   Anthropometric Measurements Height: 5' 6 (1.676 m) Weight: 201 lb (91.2 kg) BMI (Calculated): 32.46 Weight at Last Visit: 204 lb Weight Lost Since Last Visit: 3 Weight Gained Since Last Visit: 0 Starting Weight: 215 lb Total Weight Loss (lbs): 14 lb (6.35 kg)   Body Composition  Body Fat %: 38.2 % Fat Mass (lbs): 76.8 lbs Muscle Mass (lbs): 118.2 lbs Total Body Water (lbs): 83.4 lbs Visceral Fat Rating : 7   Other Clinical Data Today's Visit #: 17 Starting Date: 01/28/23 Comments: Cat 1     ASSESSMENT AND PLAN: Assessment & Plan Vitamin D  deficiency Tolerating prescription strength vitamin D  and needs a refill today.  No nausea, vomiting, or muscle weakness reported. Dyslipidemia Patient is working on limiting saturated fat to less than 20% of daily intake.  No medication/pharmacotherapy needed at this time.  Will need repeat labs in the next 3 to 4 months to reevaluate LDL response to dietary changes. BMI 32.0-32.9,adult  Obesity with  starting BMI of 34.8    Diet: Mary [name] is currently in the action stage of change. As such, her goal is to continue with weight loss efforts and has agreed to the Category 1 Plan.   Exercise:  All adults should avoid inactivity. Some activity is better than none, and adults who participate in any amount of physical activity, gain some health benefits.  Behavior Modification:  We discussed the following Behavioral Modification Strategies today: increasing lean protein intake, decreasing simple carbohydrates, meal planning and cooking strategies, holiday eating strategies, and planning for success.  Follow-up in 8 weeks   She was informed of the importance of frequent follow up visits to maximize her success with intensive lifestyle modifications for her multiple health conditions.  Attestation Statements:   Reviewed by clinician on day of visit: allergies, medications, problem list, medical history, surgical history, family history, social history, and previous encounter notes.     Mary Cho, MD

## 2024-09-05 NOTE — Assessment & Plan Note (Signed)
 Tolerating prescription strength vitamin D  and needs a refill today.  No nausea, vomiting, or muscle weakness reported.

## 2024-10-20 ENCOUNTER — Ambulatory Visit (INDEPENDENT_AMBULATORY_CARE_PROVIDER_SITE_OTHER): Admitting: Family Medicine
